# Patient Record
Sex: Male | Born: 1975 | Race: White | Hispanic: No | Marital: Single | Smoking: Former smoker
Health system: Southern US, Community
[De-identification: ages and names within clinical notes are randomized; demographics above are authoritative.]

## PROBLEM LIST (undated history)

## (undated) DIAGNOSIS — K922 Gastrointestinal hemorrhage, unspecified: Secondary | ICD-10-CM

## (undated) DIAGNOSIS — Z86018 Personal history of other benign neoplasm: Secondary | ICD-10-CM

## (undated) DIAGNOSIS — I359 Nonrheumatic aortic valve disorder, unspecified: Secondary | ICD-10-CM

## (undated) DIAGNOSIS — E237 Disorder of pituitary gland, unspecified: Secondary | ICD-10-CM

## (undated) HISTORY — PX: OTHER SURGICAL HISTORY: SHX169

## (undated) HISTORY — PX: ESOPHAGUS SURGERY: SHX626

---

## 2017-01-01 ENCOUNTER — Emergency Department (HOSPITAL_COMMUNITY)
Admission: EM | Admit: 2017-01-01 | Discharge: 2017-01-01 | Disposition: A | Discharge status: Home / Self Care | Payer: Self-pay | Attending: Emergency Medicine | Admitting: Emergency Medicine

## 2017-01-01 ENCOUNTER — Emergency Department (HOSPITAL_COMMUNITY): Payer: Self-pay

## 2017-01-01 ENCOUNTER — Encounter (HOSPITAL_COMMUNITY): Payer: Self-pay

## 2017-01-01 DIAGNOSIS — K219 Gastro-esophageal reflux disease without esophagitis: Secondary | ICD-10-CM

## 2017-01-01 DIAGNOSIS — F17210 Nicotine dependence, cigarettes, uncomplicated: Secondary | ICD-10-CM | POA: Insufficient documentation

## 2017-01-01 DIAGNOSIS — I10 Essential (primary) hypertension: Secondary | ICD-10-CM | POA: Insufficient documentation

## 2017-01-01 DIAGNOSIS — K920 Hematemesis: Secondary | ICD-10-CM

## 2017-01-01 DIAGNOSIS — K226 Gastro-esophageal laceration-hemorrhage syndrome: Secondary | ICD-10-CM | POA: Insufficient documentation

## 2017-01-01 DIAGNOSIS — Z79899 Other long term (current) drug therapy: Secondary | ICD-10-CM | POA: Insufficient documentation

## 2017-01-01 HISTORY — DX: Nonrheumatic aortic valve disorder, unspecified: I35.9

## 2017-01-01 HISTORY — DX: Personal history of other benign neoplasm: Z86.018

## 2017-01-01 HISTORY — DX: Gastrointestinal hemorrhage, unspecified: K92.2

## 2017-01-01 HISTORY — DX: Disorder of pituitary gland, unspecified: E23.7

## 2017-01-01 LAB — COMPREHENSIVE METABOLIC PANEL
ALK PHOS: 61 U/L (ref 38–126)
ALT: 41 U/L (ref 17–63)
ANION GAP: 9 (ref 5–15)
AST: 45 U/L — ABNORMAL HIGH (ref 15–41)
Albumin: 4.4 g/dL (ref 3.5–5.0)
BILIRUBIN TOTAL: 0.7 mg/dL (ref 0.3–1.2)
BUN: 11 mg/dL (ref 6–20)
CO2: 24 mmol/L (ref 22–32)
CREATININE: 0.83 mg/dL (ref 0.61–1.24)
Calcium: 8.9 mg/dL (ref 8.9–10.3)
Chloride: 104 mmol/L (ref 101–111)
GFR calc Af Amer: >60 mL/min (ref >60)
GFR calc non Af Amer: >60 mL/min (ref >60)
GLUCOSE: 105 mg/dL — AB (ref 65–99)
Potassium: 3.9 mmol/L (ref 3.5–5.1)
SODIUM: 137 mmol/L (ref 135–145)
TOTAL PROTEIN: 7.7 g/dL (ref 6.5–8.1)

## 2017-01-01 LAB — CBC
HCT: 42.6 % (ref 39.0–52.0)
HEMOGLOBIN: 13.9 g/dL (ref 13.0–17.0)
MCH: 26.6 pg (ref 26.0–34.0)
MCHC: 32.6 g/dL (ref 30.0–36.0)
MCV: 81.6 fL (ref 78.0–100.0)
PLATELETS: 429 10*3/uL — AB (ref 150–400)
RBC: 5.22 MIL/uL (ref 4.22–5.81)
RDW: 17.7 % — AB (ref 11.5–15.5)
WBC: 11.1 10*3/uL — ABNORMAL HIGH (ref 4.0–10.5)

## 2017-01-01 LAB — TYPE AND SCREEN
ABO/RH(D): A POS
ANTIBODY SCREEN: NEGATIVE

## 2017-01-01 LAB — ABO/RH: ABO/RH(D): A POS

## 2017-01-01 MED ORDER — MORPHINE SULFATE (PF) 4 MG/ML IV SOLN
4.0000 mg | Freq: Once | INTRAVENOUS | Status: AC
  Administered 2017-01-01: 4 mg via INTRAVENOUS
  Filled 2017-01-01: qty 1

## 2017-01-01 MED ORDER — SODIUM CHLORIDE 0.9 % IV BOLUS (SEPSIS)
1000.0000 mL | Freq: Once | INTRAVENOUS | Status: AC
  Administered 2017-01-01: 1000 mL via INTRAVENOUS

## 2017-01-01 MED ORDER — FAMOTIDINE IN NACL 20-0.9 MG/50ML-% IV SOLN
20.0000 mg | Freq: Once | INTRAVENOUS | Status: AC
  Administered 2017-01-01: 20 mg via INTRAVENOUS
  Filled 2017-01-01: qty 50

## 2017-01-01 MED ORDER — ONDANSETRON HCL 4 MG/2ML IJ SOLN
4.0000 mg | Freq: Once | INTRAMUSCULAR | Status: AC
  Administered 2017-01-01: 4 mg via INTRAVENOUS
  Filled 2017-01-01: qty 2

## 2017-01-01 MED ORDER — OXYCODONE-ACETAMINOPHEN 5-325 MG PO TABS: 1.0000 | ORAL_TABLET | ORAL | 0 refills | Status: AC | PRN

## 2017-01-01 NOTE — ED Triage Notes (Signed)
Patient c/o vomiting blood. Patient has bright red blood in an emesis bag. Patient has history of esophageal bleeding. [facility].

## 2017-01-01 NOTE — ED Provider Notes (Signed)
[facility]-EMERGENCY DEPT Provider Note   CSN: [id number] Arrival date & time: 01/01/17  1608     History   Chief Complaint Chief Complaint  Patient presents with  . Emesis    HPI Jesus Norman is a 41 y.o. male.  Pt presents to the ED today with n/v and hematemesis.  Pt has had this problem a lot.  He said it started after drinking and feels like he got something stuck.  He vomited forcefully after that.  He does not feel like anything is stuck now.  Cut and paste from last GI visit (Dr. Janeal Holmes) note Jesus Norman 1/8:  ASSESSMENT:   This is a 41yo M w/ PMH significant for HTN, VTE, anxiety, Raynaud's, Celiac's disease, and GERD s/p Nissen in 2012 who is seen for possible esophageal dysmotility. Patient underwent Nissen fundoplication in 2012 for severe GERD. Prior to this he denies issues with dysphagia or sensation of spasming in his chest though he thinks his pre-op esophageal manometry may have shown some abnormal esophageal motility.  For the past 3 years he has experienced progressively worsening solid food dysphagia, spasms in his chest, and heartburn. Multiple EGDs have failed to show an obstructive lesion or other cause other than his Nissen (which has appeared intact on these EGDs). 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 His symptoms have also failed to respond to empiric dilation in Dec 2016 (20mm Savary) and Oct 2017 (20mm balloon dilator). The etiology of his ongoing dysphagia and chest spasms remains unclear. His Nissen wrap may be too tight and may need more aggressive dilation with pneumatic dilator. Alternatively, he could have an underlying motility disorder such as achalasia, in which case he may ultimately need his Nissen taken down. To further clarify, will need to perform his esophageal manometry again, this time off all narcotic  medications to give [place] a better understanding of his underlying motility. Since he was unable to tolerate manometry probe placement a few months ago, will perform EGD to place manometry probe and to evaluate his esophagus and Nissen wrap.    PLAN:   - schedule for EGD with esophageal manometry probe placement with Dr. Enrigue Catena.  - continue PPI, baclofen, diltiazem, nitroglycerin, and GI cocktail PRN.  - patient asked to stop norco and avoid any other opiates 3 days prior to manometry.            Past Medical History:  Diagnosis Date  . Aortic valve disorder   . GI bleed   . History of benign esophageal tumor   . Pituitary abnormality (HCC)     There are no active problems to display for this patient.   Past Surgical History:  Procedure Laterality Date  . ESOPHAGUS SURGERY    . nissan fundiplication         Home Medications    Prior to Admission medications   Medication Sig Start Date End Date Taking? Authorizing Provider  acetaminophen (TYLENOL) 325 MG tablet Take 650 mg by mouth every 6 (six) hours as needed.   Yes Historical Provider, MD  albuterol (ACCUNEB) 0.63 MG/3ML nebulizer solution Take 0.63 mg by nebulization every 6 (six) hours as needed for wheezing.   Yes Historical Provider, MD  albuterol (PROVENTIL HFA;VENTOLIN HFA) 108 (90 Base) MCG/ACT inhaler Inhale 2 puffs into the lungs every 6 (six) hours as needed for wheezing.   Yes Historical Provider, MD  Alum &  Mag Hydroxide-Simeth (GI COCKTAIL) SUSP suspension Take 1 Dose by mouth daily. 10/08/16  Yes Historical Provider, MD  baclofen (LIORESAL) 10 MG tablet Take 10 mg by mouth 3 (three) times daily. 09/15/16  Yes Historical Provider, MD  benzonatate (TESSALON) 100 MG capsule Take 100 mg by mouth 3 (three) times daily as needed for cough. 12/21/16  Yes Historical Provider, MD  buPROPion (WELLBUTRIN) 75 MG tablet Take 150 mg by mouth 2 (two) times daily. 10/29/16  Yes Historical Provider, MD  Cholecalciferol  (VITAMIN D3) 5000 units CAPS Take 5,000 Units by mouth daily. 02/18/16  Yes Historical Provider, MD  dicyclomine (BENTYL) 20 MG tablet Take 20 mg by mouth 4 (four) times daily.   Yes Historical Provider, MD  diltiazem (CARDIZEM CD) 240 MG 24 hr capsule Take 240 mg by mouth daily. 05/18/16  Yes Historical Provider, MD  divalproex (DEPAKOTE ER) 500 MG 24 hr tablet Take 1,000 mg by mouth at bedtime.   Yes Historical Provider, MD  ferrous gluconate ([employer] IRON) 240 (27 FE) MG tablet Take 240 mg by mouth 2 (two) times daily. 05/18/16  Yes Historical Provider, MD  furosemide (LASIX) 20 MG tablet Take 20 mg by mouth daily. 07/06/16 07/06/17 Yes Historical Provider, MD  HYDROcodone-acetaminophen (NORCO/VICODIN) 5-325 MG tablet Take 1 tablet by mouth every 6 (six) hours as needed for pain. for pain 10/29/16  Yes Historical Provider, MD  mirtazapine (REMERON) 15 MG tablet Take 15 mg by mouth at bedtime. 10/08/16  Yes Historical Provider, MD  Multiple Vitamin (MULTI-VITAMIN) tablet Take 1 tablet by mouth daily.   Yes Historical Provider, MD  nitroGLYCERIN (NITROSTAT) 0.4 MG SL tablet Place 0.4 mg under the tongue once. 11/02/16  Yes Historical Provider, MD  ondansetron (ZOFRAN-ODT) 4 MG disintegrating tablet Take 4 mg by mouth every 8 (eight) hours as needed for nausea/vomiting. 09/15/16  Yes Historical Provider, MD  pantoprazole (PROTONIX) 40 MG tablet Take 40 mg by mouth 2 (two) times daily.   Yes Historical Provider, MD  sucralfate (CARAFATE) 1 GM/10ML suspension Take 15 mLs by mouth 4 (four) times daily.   Yes Historical Provider, MD  vitamin C (ASCORBIC ACID) 500 MG tablet Take 500 mg by mouth 2 (two) times daily.   Yes Historical Provider, MD  oxyCODONE-acetaminophen (PERCOCET/ROXICET) 5-325 MG tablet Take 1 tablet by mouth every 4 (four) hours as needed for severe pain. 01/01/17   Jacalyn Lefevre, MD    Family History Family History  Problem Relation Age of Onset  . COPD Mother   . Heart failure Mother   .  Heart failure Father   . Cancer Father   . COPD Father     Social History Social History  Substance Use Topics  . Smoking status: Current Every Day Smoker    Packs/day: 0.50    Types: Cigarettes  . Smokeless tobacco: Never Used  . Alcohol use Yes     Comment: daily use     Allergies   Compazine [prochlorperazine edisylate]; Doxycycline; Gluten meal; Keflex [cephalexin]; Nsaids; Penicillins; Reglan [metoclopramide]; and Wheat bran   Review of Systems Review of Systems  Gastrointestinal: Positive for nausea and vomiting.       Hematemesis  All other systems reviewed and are negative.    Physical Exam Updated Vital Signs BP 143/97 (BP Location: Right Arm)   Pulse 71   Temp 98 F (36.7 C) (Oral)   Resp 17   Ht 5\' 10"  (1.778 m)   Wt 260 lb (117.9 kg)   SpO2 98%  BMI 37.31 kg/m   Physical Exam  Constitutional: He is oriented to person, place, and time. He appears well-developed and well-nourished.  HENT:  Head: Normocephalic and atraumatic.  Right Ear: External ear normal.  Left Ear: External ear normal.  Nose: Nose normal.  Mouth/Throat: Oropharynx is clear and moist.  Eyes: Conjunctivae and EOM are normal. Pupils are equal, round, and reactive to light.  Neck: Normal range of motion. Neck supple.  Cardiovascular: Normal rate, regular rhythm, normal heart sounds and intact distal pulses.   Pulmonary/Chest: Effort normal and breath sounds normal.  Abdominal: Soft. Bowel sounds are normal. There is tenderness in the epigastric area.  Musculoskeletal: Normal range of motion.  Neurological: He is alert and oriented to person, place, and time.  Skin: Skin is warm and dry.  Psychiatric: He has a normal mood and affect. His behavior is normal. Judgment and thought content normal.  Nursing note and vitals reviewed.    ED Treatments / Results  Labs (all labs ordered are listed, but only abnormal results are displayed) Labs Reviewed  COMPREHENSIVE METABOLIC PANEL  - Abnormal; Notable for the following:       Result Value   Glucose, Bld 105 (*)    AST 45 (*)    All other components within normal limits  CBC - Abnormal; Notable for the following:    WBC 11.1 (*)    RDW 17.7 (*)    Platelets 429 (*)    All other components within normal limits  TYPE AND SCREEN  ABO/RH    EKG  EKG Interpretation None       Radiology Dg Chest 2 View  Result Date: 01/01/2017 CLINICAL DATA:  Cough for 3 days. EXAM: CHEST  2 VIEW COMPARISON:  None. FINDINGS: The heart size and mediastinal contours are within normal limits. Both lungs are clear. The visualized skeletal structures are unremarkable. IMPRESSION: No active cardiopulmonary disease. Electronically Signed   By: Myles RosenthalJohn  Stahl M.D.   On: 01/01/2017 18:19    Procedures Procedures (including critical care time)  Medications Ordered in ED Medications  sodium chloride 0.9 % bolus 1,000 mL (0 mLs Intravenous Stopped 01/01/17 1901)  ondansetron (ZOFRAN) injection 4 mg (4 mg Intravenous Given 01/01/17 1656)  famotidine (PEPCID) IVPB 20 mg premix (0 mg Intravenous Stopped 01/01/17 1725)  morphine 4 MG/ML injection 4 mg (4 mg Intravenous Given 01/01/17 1821)     Initial Impression / Assessment and Plan / ED Course  I have reviewed the triage vital signs and the nursing notes.  Pertinent labs & imaging results that were available during my care of the patient were reviewed by me and considered in my medical decision making (see chart for details).    Pt is feeling much better.  He is able to tolerate po fluids.  He has all the appropriate GERD meds.  [facility]  He knows to return if worse.  Final Clinical Impressions(s) / ED Diagnoses   Final diagnoses:  Mallory-Weiss tear  Hematemesis with nausea  Gastroesophageal reflux disease, esophagitis presence not specified    New Prescriptions New Prescriptions   OXYCODONE-ACETAMINOPHEN (PERCOCET/ROXICET) 5-325 MG  TABLET    Take 1 tablet by mouth every 4 (four) hours as needed for severe pain.     Jacalyn LefevreJulie Durene Dodge, MD 01/01/17 [phone]

## 2018-04-09 IMAGING — CR DG CHEST 2V
2 series · 2 of 2 positions shown · non-contrast
Comparison: None.

CLINICAL DATA: Cough for 3 days.

EXAM:
CHEST  2 VIEW

[w chest pa]
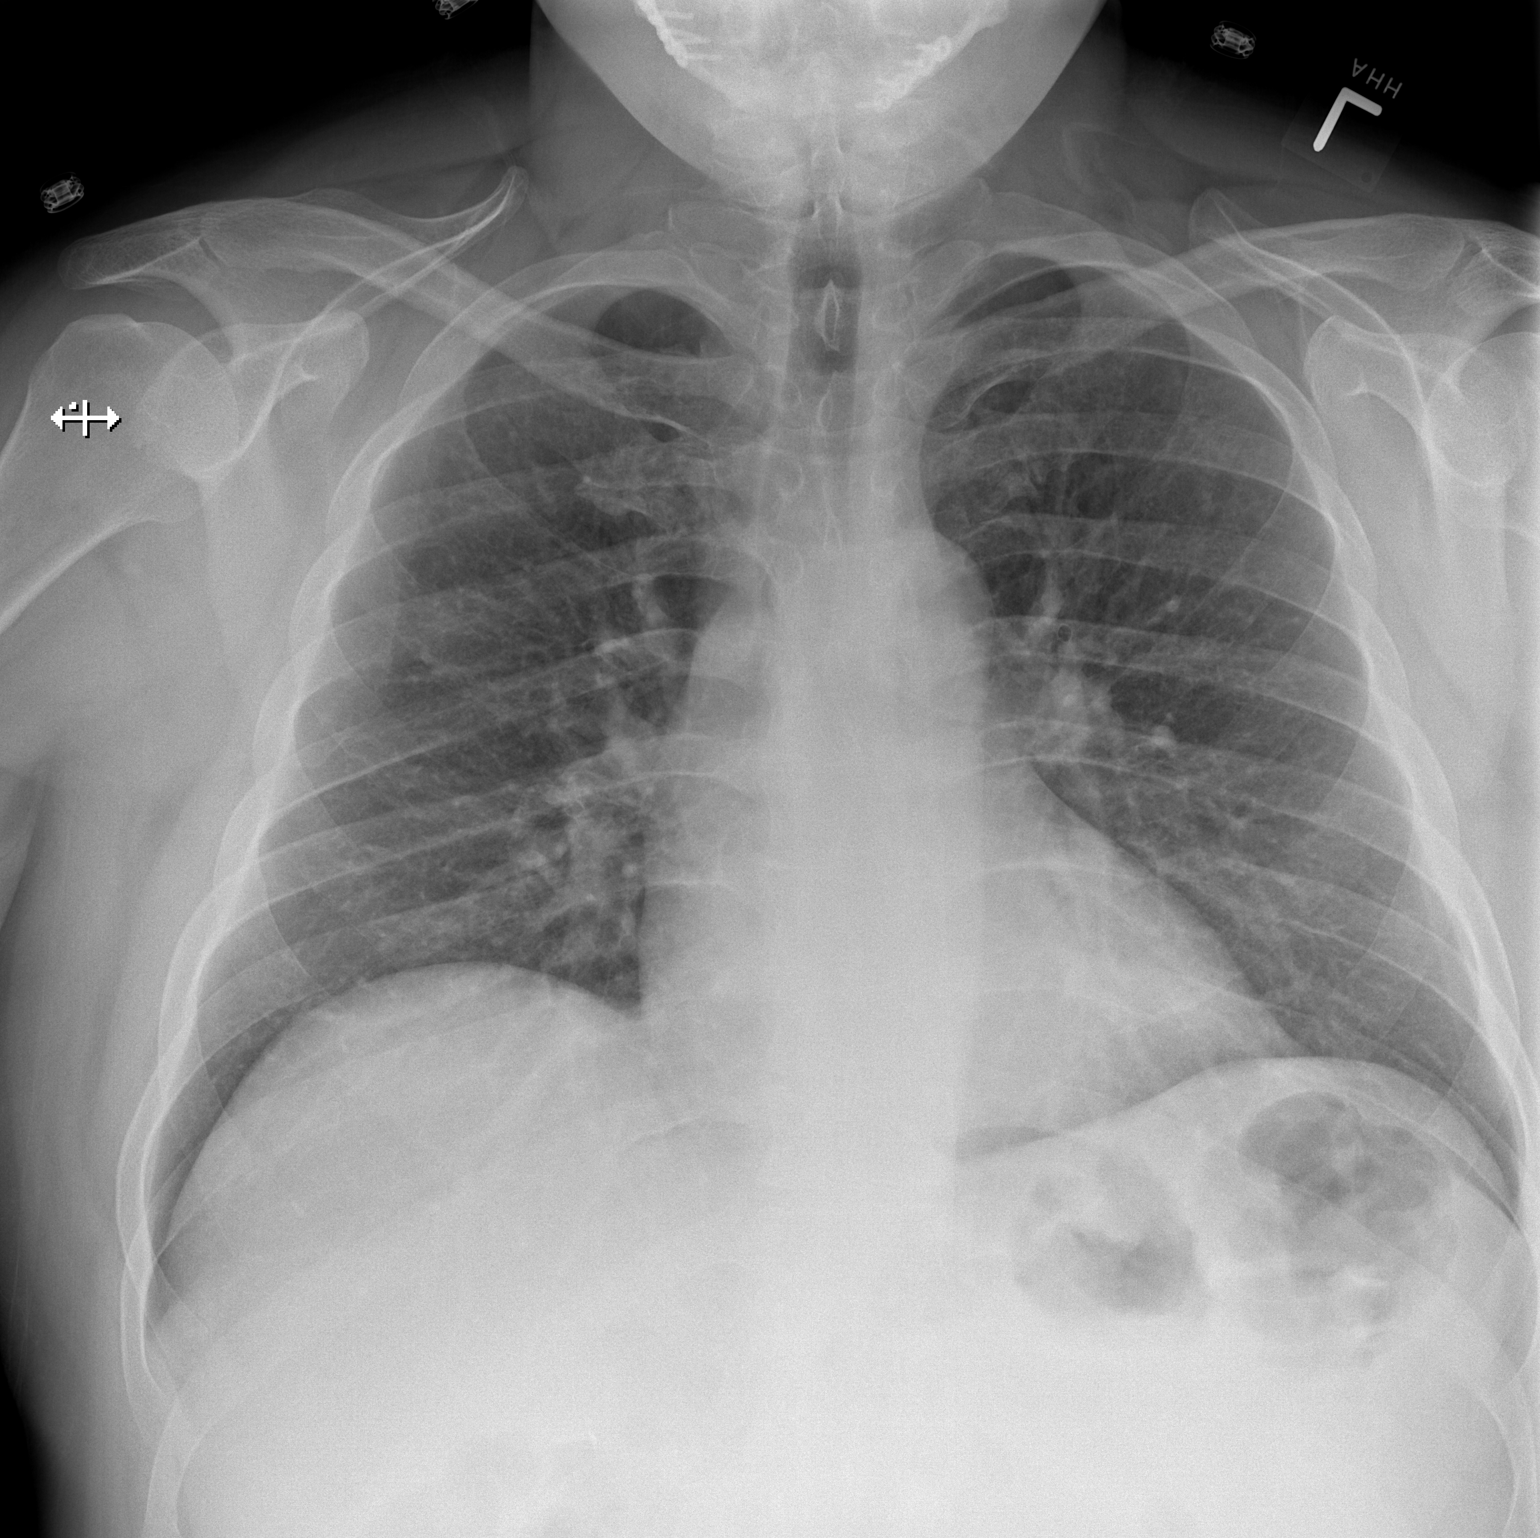

[w chest lat]
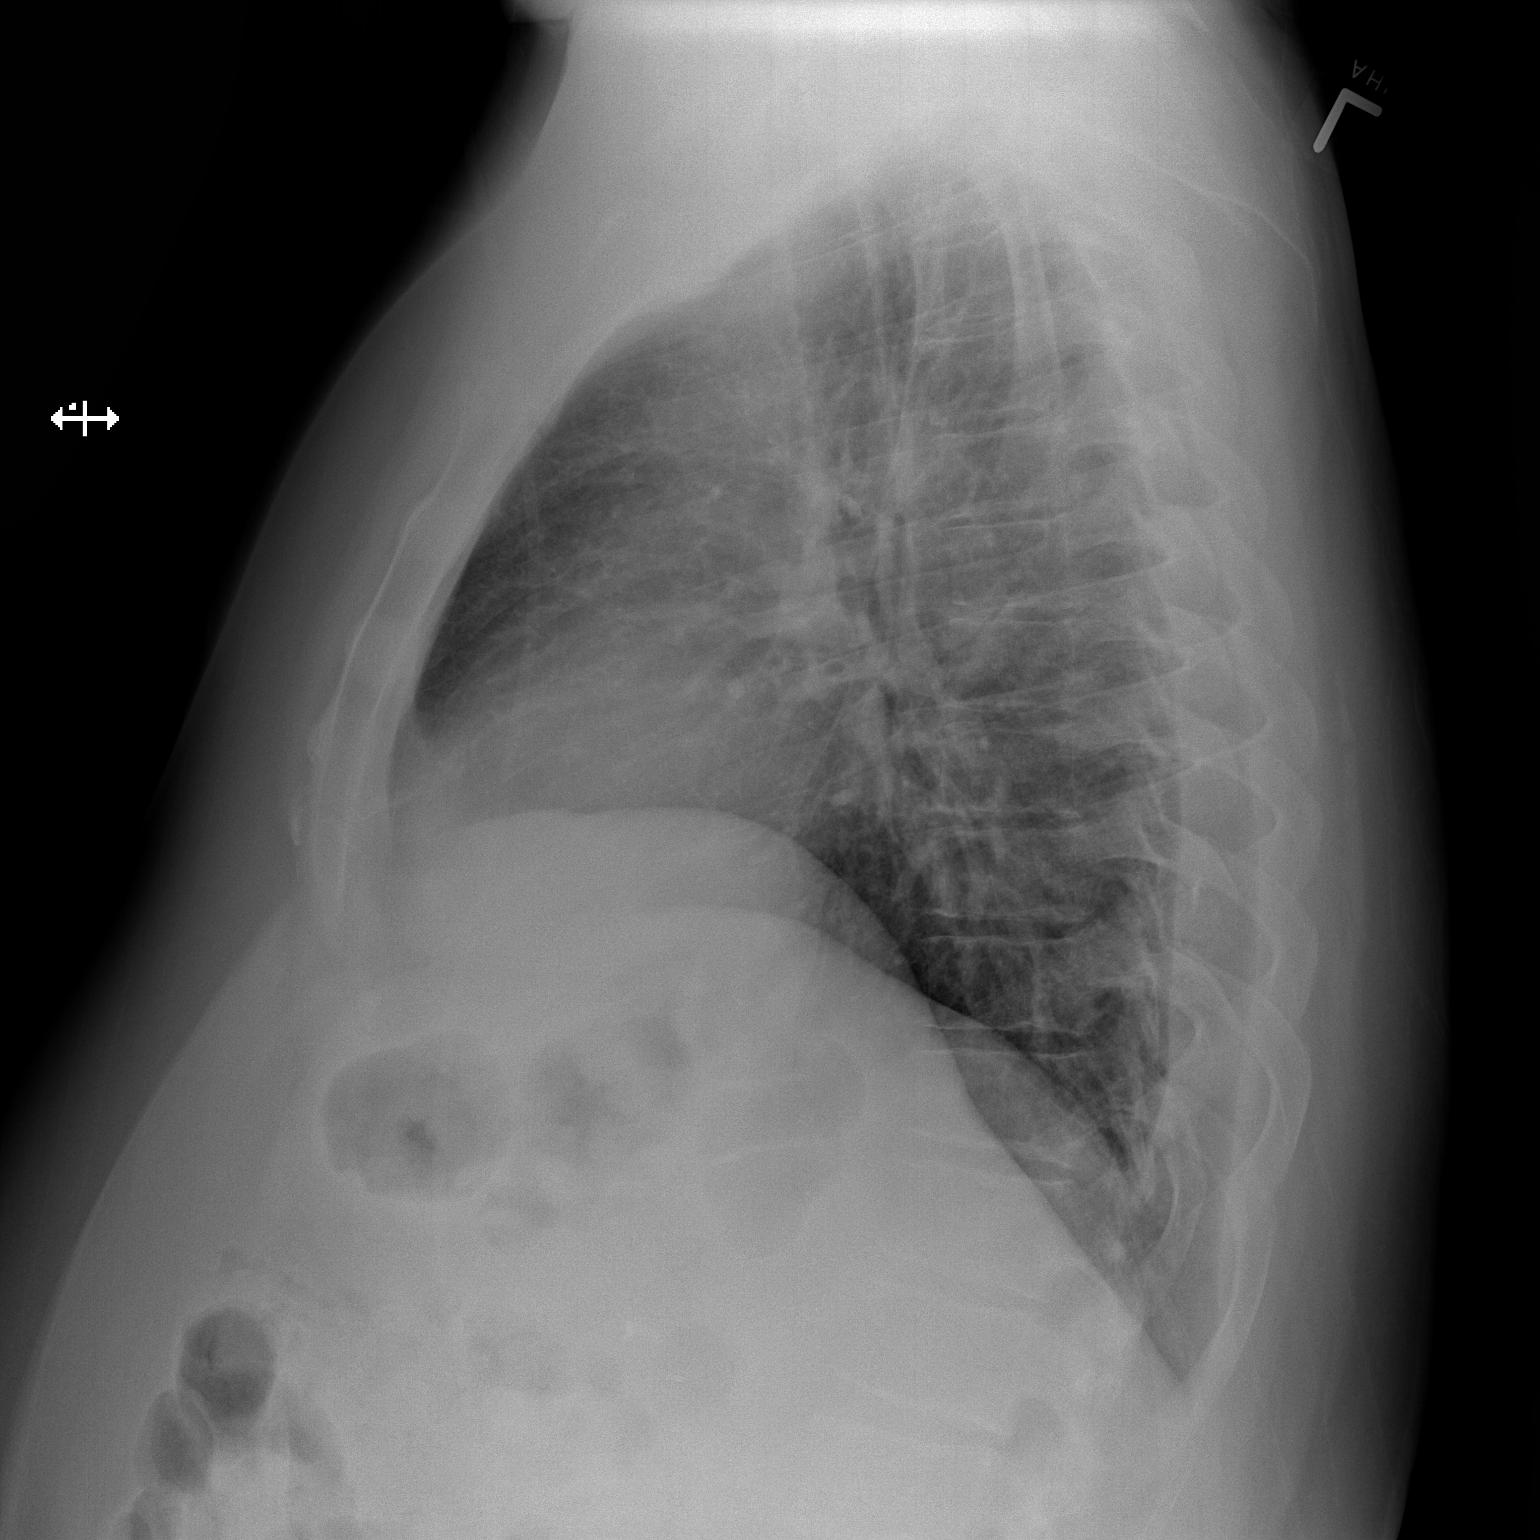

[2 of 2 positions shown; findings below may reference images not displayed]

FINDINGS: The heart size and mediastinal contours are within normal limits.
Both lungs are clear. The visualized skeletal structures are
unremarkable.
IMPRESSION: No active cardiopulmonary disease.

## 2022-09-23 ENCOUNTER — Encounter (HOSPITAL_COMMUNITY): Payer: Self-pay | Admitting: Emergency Medicine

## 2022-09-23 ENCOUNTER — Emergency Department (HOSPITAL_COMMUNITY)

## 2022-09-23 ENCOUNTER — Inpatient Hospital Stay (HOSPITAL_COMMUNITY)
Admission: EM | Admit: 2022-09-23 | Discharge: 2022-09-26 | DRG: 379 | Disposition: A | Discharge status: Home / Self Care | Attending: Internal Medicine | Admitting: Internal Medicine

## 2022-09-23 ENCOUNTER — Other Ambulatory Visit: Payer: Self-pay

## 2022-09-23 DIAGNOSIS — R1319 Other dysphagia: Secondary | ICD-10-CM

## 2022-09-23 DIAGNOSIS — J449 Chronic obstructive pulmonary disease, unspecified: Secondary | ICD-10-CM | POA: Diagnosis present

## 2022-09-23 DIAGNOSIS — E669 Obesity, unspecified: Secondary | ICD-10-CM | POA: Diagnosis present

## 2022-09-23 DIAGNOSIS — K922 Gastrointestinal hemorrhage, unspecified: Principal | ICD-10-CM

## 2022-09-23 DIAGNOSIS — Z91018 Allergy to other foods: Secondary | ICD-10-CM | POA: Diagnosis not present

## 2022-09-23 DIAGNOSIS — K219 Gastro-esophageal reflux disease without esophagitis: Secondary | ICD-10-CM | POA: Diagnosis present

## 2022-09-23 DIAGNOSIS — Z886 Allergy status to analgesic agent status: Secondary | ICD-10-CM | POA: Diagnosis not present

## 2022-09-23 DIAGNOSIS — R1310 Dysphagia, unspecified: Secondary | ICD-10-CM | POA: Diagnosis not present

## 2022-09-23 DIAGNOSIS — Z7951 Long term (current) use of inhaled steroids: Secondary | ICD-10-CM | POA: Diagnosis not present

## 2022-09-23 DIAGNOSIS — Z87891 Personal history of nicotine dependence: Secondary | ICD-10-CM

## 2022-09-23 DIAGNOSIS — Z7985 Long-term (current) use of injectable non-insulin antidiabetic drugs: Secondary | ICD-10-CM

## 2022-09-23 DIAGNOSIS — Z7901 Long term (current) use of anticoagulants: Secondary | ICD-10-CM

## 2022-09-23 DIAGNOSIS — I10 Essential (primary) hypertension: Secondary | ICD-10-CM | POA: Diagnosis present

## 2022-09-23 DIAGNOSIS — Z79899 Other long term (current) drug therapy: Secondary | ICD-10-CM

## 2022-09-23 DIAGNOSIS — Z881 Allergy status to other antibiotic agents status: Secondary | ICD-10-CM

## 2022-09-23 DIAGNOSIS — E119 Type 2 diabetes mellitus without complications: Secondary | ICD-10-CM | POA: Diagnosis present

## 2022-09-23 DIAGNOSIS — K224 Dyskinesia of esophagus: Secondary | ICD-10-CM | POA: Diagnosis present

## 2022-09-23 DIAGNOSIS — Z8249 Family history of ischemic heart disease and other diseases of the circulatory system: Secondary | ICD-10-CM

## 2022-09-23 DIAGNOSIS — Z880 Allergy status to penicillin: Secondary | ICD-10-CM

## 2022-09-23 DIAGNOSIS — E876 Hypokalemia: Secondary | ICD-10-CM | POA: Diagnosis present

## 2022-09-23 DIAGNOSIS — E785 Hyperlipidemia, unspecified: Secondary | ICD-10-CM

## 2022-09-23 DIAGNOSIS — K2970 Gastritis, unspecified, without bleeding: Secondary | ICD-10-CM | POA: Diagnosis not present

## 2022-09-23 DIAGNOSIS — I359 Nonrheumatic aortic valve disorder, unspecified: Secondary | ICD-10-CM | POA: Diagnosis present

## 2022-09-23 DIAGNOSIS — Z86711 Personal history of pulmonary embolism: Secondary | ICD-10-CM | POA: Diagnosis not present

## 2022-09-23 DIAGNOSIS — K900 Celiac disease: Secondary | ICD-10-CM | POA: Diagnosis present

## 2022-09-23 DIAGNOSIS — Z888 Allergy status to other drugs, medicaments and biological substances status: Secondary | ICD-10-CM

## 2022-09-23 DIAGNOSIS — Z6839 Body mass index (BMI) 39.0-39.9, adult: Secondary | ICD-10-CM

## 2022-09-23 DIAGNOSIS — Z86718 Personal history of other venous thrombosis and embolism: Secondary | ICD-10-CM | POA: Diagnosis not present

## 2022-09-23 DIAGNOSIS — Z825 Family history of asthma and other chronic lower respiratory diseases: Secondary | ICD-10-CM | POA: Diagnosis not present

## 2022-09-23 DIAGNOSIS — K2971 Gastritis, unspecified, with bleeding: Secondary | ICD-10-CM | POA: Diagnosis present

## 2022-09-23 DIAGNOSIS — K2100 Gastro-esophageal reflux disease with esophagitis, without bleeding: Secondary | ICD-10-CM | POA: Diagnosis not present

## 2022-09-23 LAB — COMPREHENSIVE METABOLIC PANEL
ALT: 48 U/L — ABNORMAL HIGH (ref 0–44)
AST: 28 U/L (ref 15–41)
Albumin: 4.2 g/dL (ref 3.5–5.0)
Alkaline Phosphatase: 79 U/L (ref 38–126)
Anion gap: 8 (ref 5–15)
BUN: 18 mg/dL (ref 6–20)
CO2: 23 mmol/L (ref 22–32)
Calcium: 8.4 mg/dL — ABNORMAL LOW (ref 8.9–10.3)
Chloride: 108 mmol/L (ref 98–111)
Creatinine, Ser: 0.9 mg/dL (ref 0.61–1.24)
GFR, Estimated: >60 mL/min (ref >60)
Glucose, Bld: 135 mg/dL — ABNORMAL HIGH (ref 70–99)
Potassium: 3.4 mmol/L — ABNORMAL LOW (ref 3.5–5.1)
Sodium: 139 mmol/L (ref 135–145)
Total Bilirubin: 0.4 mg/dL (ref 0.3–1.2)
Total Protein: 6.9 g/dL (ref 6.5–8.1)

## 2022-09-23 LAB — CBC WITH DIFFERENTIAL/PLATELET
Abs Immature Granulocytes: 0.04 10*3/uL (ref 0.00–0.07)
Basophils Absolute: 0 10*3/uL (ref 0.0–0.1)
Basophils Relative: 0 %
Eosinophils Absolute: 0.2 10*3/uL (ref 0.0–0.5)
Eosinophils Relative: 2 %
HCT: 43.5 % (ref 39.0–52.0)
Hemoglobin: 14.4 g/dL (ref 13.0–17.0)
Immature Granulocytes: 0 %
Lymphocytes Relative: 22 %
Lymphs Abs: 2.4 10*3/uL (ref 0.7–4.0)
MCH: 29.3 pg (ref 26.0–34.0)
MCHC: 33.1 g/dL (ref 30.0–36.0)
MCV: 88.4 fL (ref 80.0–100.0)
Monocytes Absolute: 0.9 10*3/uL (ref 0.1–1.0)
Monocytes Relative: 8 %
Neutro Abs: 7.1 10*3/uL (ref 1.7–7.7)
Neutrophils Relative %: 68 %
Platelets: 276 10*3/uL (ref 150–400)
RBC: 4.92 MIL/uL (ref 4.22–5.81)
RDW: 14.4 % (ref 11.5–15.5)
WBC: 10.6 10*3/uL — ABNORMAL HIGH (ref 4.0–10.5)
nRBC: 0 % (ref 0.0–0.2)

## 2022-09-23 LAB — HEMOGLOBIN AND HEMATOCRIT, BLOOD
HCT: 38.8 % — ABNORMAL LOW (ref 39.0–52.0)
Hemoglobin: 12.7 g/dL — ABNORMAL LOW (ref 13.0–17.0)

## 2022-09-23 LAB — PROTIME-INR
INR: 1.4 — ABNORMAL HIGH (ref 0.8–1.2)
Prothrombin Time: 16.7 seconds — ABNORMAL HIGH (ref 11.4–15.2)

## 2022-09-23 LAB — ETHANOL: Alcohol, Ethyl (B): <10 mg/dL (ref <10)

## 2022-09-23 MED ORDER — METOCLOPRAMIDE HCL 5 MG/ML IJ SOLN
10.0000 mg | Freq: Three times a day (TID) | INTRAMUSCULAR | Status: DC
  Administered 2022-09-24 – 2022-09-25 (×5): 10 mg via INTRAVENOUS
  Filled 2022-09-23 (×5): qty 2

## 2022-09-23 MED ORDER — PANTOPRAZOLE SODIUM 40 MG IV SOLR
40.0000 mg | Freq: Once | INTRAVENOUS | Status: AC
  Administered 2022-09-23: 40 mg via INTRAVENOUS
  Filled 2022-09-23: qty 10

## 2022-09-23 NOTE — ED Triage Notes (Addendum)
Pt from [facility] d/t hematemesis w/difficulty swallowing x 2 days. Hx of esophageal stretching, varices, gastric ulcers. Pt on xarelto.

## 2022-09-23 NOTE — ED Notes (Signed)
Attempted report 

## 2022-09-23 NOTE — ED Provider Notes (Signed)
[facility] EMERGENCY DEPARTMENT Provider Note   CSN: [id number] Arrival date & time: 09/23/22  1820     History  Chief Complaint  Patient presents with   Dysphagia    Sourish Jesus Norman is a 46 y.o. male.  HPI Patient presents with difficulty swallowing.  Feels as if food gets stuck in his esophagus.  States he has had some vomiting.  He is on Xarelto for previous DVTs.  Has had previous gastritis.  Has had previous esophageal stretching's.  Has seen providers at [facility] and at [facility].  Also at [facility].  Patient is currently in custody.  States he threw up and had a little bit of blood.  Then became a little more blood.  Mild pain.  No fevers.  No lightheadedness.  States he feels that he has had a little chills also.  States he was told 1 time that he had esophageal varices although most recent endoscopy from around a month and a half ago did not show any varices and showed essentially normal esophagus.   Past Medical History:  Diagnosis Date   Aortic valve disorder    GI bleed    History of benign esophageal tumor    Pituitary abnormality [facility])    Past Surgical History:  Procedure Laterality Date   ESOPHAGUS SURGERY     nissan fundiplication       Home Medications Prior to Admission medications   Medication Sig Start Date End Date Taking? Authorizing Provider  acetaminophen (TYLENOL) 325 MG tablet Take 325 mg by mouth every 6 (six) hours as needed for moderate pain.   Yes [provider]  albuterol (PROVENTIL HFA;VENTOLIN HFA) 108 (90 Base) MCG/ACT inhaler Inhale 2 puffs into the lungs every 6 (six) hours as needed for wheezing.   Yes [provider]  amLODipine (NORVASC) 5 MG tablet Take by mouth. 04/18/22  Yes [provider]  ascorbic acid (VITAMIN C) 500 MG tablet Take by mouth.   Yes [provider]  baclofen (LIORESAL) 10 MG tablet Take 10 mg by mouth 3 (three) times daily. 09/15/16  Yes [provider]  buPROPion (WELLBUTRIN) 75 MG  tablet Take 150 mg by mouth 2 (two) times daily. 10/29/16  Yes [provider]  dicyclomine (BENTYL) 20 MG tablet Take 20 mg by mouth 4 (four) times daily.   Yes [provider]  ferrous sulfate 325 (65 FE) MG tablet Take by mouth.   Yes [provider]  fluticasone-salmeterol (ADVAIR) 250-50 MCG/ACT AEPB Inhale into the lungs.   Yes [provider]  furosemide (LASIX) 20 MG tablet Take by mouth daily. 07/06/16 09/23/22 Yes [provider]  mometasone-formoterol (DULERA) 100-5 MCG/ACT AERO Inhale into the lungs. 02/11/22  Yes [provider]  Multiple Vitamin (MULTI-VITAMIN) tablet Take 1 tablet by mouth daily.   Yes [provider]  naloxone (NARCAN) nasal spray 4 mg/0.1 mL Place into the nose. 04/07/21  Yes [provider]  nitroGLYCERIN (NITROSTAT) 0.4 MG SL tablet Place 0.4 mg under the tongue once. 11/02/16  Yes [provider]  ondansetron (ZOFRAN-ODT) 4 MG disintegrating tablet Take 4 mg by mouth every 8 (eight) hours as needed for nausea/vomiting. 09/15/16  Yes [provider]  oxyCODONE-acetaminophen (PERCOCET/ROXICET) 5-325 MG tablet Take 1 tablet by mouth every 4 (four) hours as needed for severe pain. 01/01/17  Yes Jacalyn Lefevre, MD  rivaroxaban (XARELTO) 20 MG TABS tablet Take 20 mg by mouth daily. 02/10/19  Yes [provider]  rosuvastatin (CRESTOR) 10  MG tablet Take by mouth. 02/11/22  Yes [provider]  spironolactone (ALDACTONE) 25 MG tablet Take 25 mg by mouth daily. 02/11/22  Yes [provider]  sucralfate (CARAFATE) 1 g tablet Take 1 g by mouth 4 (four) times daily.   Yes [provider]  tirzepatide Darcel Bayley) 5 MG/0.5ML Pen Inject into the skin. 03/12/22  Yes [provider]  vitamin C (ASCORBIC ACID) 500 MG tablet Take 500 mg by mouth 2 (two) times daily.   Yes [provider]  diphenhydrAMINE (BENADRYL) 25 mg capsule Take by mouth.     [provider]  divalproex (DEPAKOTE ER) 500 MG 24 hr tablet Take 1,000 mg by mouth at bedtime.    [provider]      Allergies    Compazine [prochlorperazine edisylate], Doxycycline, Gluten meal, Keflex [cephalexin], Nsaids, Penicillins, Reglan [metoclopramide], and Wheat bran    Review of Systems   Review of Systems  Physical Exam Updated Vital Signs BP (!) 103/58   Pulse 88   Temp 98.2 F (36.8 C) (Oral)   Resp 19   Ht 5\' 10"  (1.778 m)   Wt 122.9 kg   SpO2 93%   BMI 38.88 kg/m  Physical Exam Vitals and nursing note reviewed.  HENT:     Head: Atraumatic.  Eyes:     Pupils: Pupils are equal, round, and reactive to light.  Cardiovascular:     Rate and Rhythm: Regular rhythm. Tachycardia present.  Pulmonary:     Breath sounds: No wheezing.  Abdominal:     Tenderness: There is no abdominal tenderness.  Musculoskeletal:        General: No tenderness.  Skin:    General: Skin is warm.     Capillary Refill: Capillary refill takes less than 2 seconds.  Neurological:     Mental Status: He is alert and oriented to person, place, and time.     ED Results / Procedures / Treatments   Labs (all labs ordered are listed, but only abnormal results are displayed) Labs Reviewed  COMPREHENSIVE METABOLIC PANEL - Abnormal; Notable for the following components:      Result Value   Potassium 3.4 (*)    Glucose, Bld 135 (*)    Calcium 8.4 (*)    ALT 48 (*)    All other components within normal limits  PROTIME-INR - Abnormal; Notable for the following components:   Prothrombin Time 16.7 (*)    INR 1.4 (*)    All other components within normal limits  CBC WITH DIFFERENTIAL/PLATELET - Abnormal; Notable for the following components:   WBC 10.6 (*)    All other components within normal limits  ETHANOL    EKG None  Radiology DG Chest Portable 1 View  Result Date: 09/23/2022 CLINICAL DATA:  Chest pain.  Hematemesis. EXAM: PORTABLE CHEST 1 VIEW COMPARISON:   Chest 01/01/2017 FINDINGS: The heart size and mediastinal contours are within normal limits. Both lungs are clear. The visualized skeletal structures are unremarkable. IMPRESSION: No active disease. Electronically Signed   By: Franchot Gallo M.D.   On: 09/23/2022 19:31    Procedures Procedures    Medications Ordered in ED Medications - No data to display  ED Course/ Medical Decision Making/ A&P                           Medical Decision Making Amount and/or Complexity of Data Reviewed Labs: ordered. Radiology: ordered.   Patient with  feeling of food getting stuck in his esophagus.  For some potential hematemesis.  Well-appearing with mild tachycardia.  Differential diagnosis includes esophageal strictures, Mallory-Weiss tear, bleeding.  Questionable history of esophageal varices but reviewing recent admission notes did not have varices with recent endoscopy.  We will get basic blood work and continue to evaluate. Blood work reassuring.  Slight hypotension but hemoglobin reassuring.  Is on anticoagulation however and has had another episode of a small amount of hematemesis here.  Discussed with Dr. Levon Hedger from gastroenterology.  Believes patient can stay here.  Recommended PPI and a dose of Reglan.  Likely will have scope tomorrow.  Will discuss with hospitalist.  CRITICAL CARE Performed by: Benjiman Core Total critical care time: 30 minutes Critical care time was exclusive of separately billable procedures and treating other patients. Critical care was necessary to treat or prevent imminent or life-threatening deterioration. Critical care was time spent personally by me on the following activities: development of treatment plan with patient and/or surrogate as well as nursing, discussions with consultants, evaluation of patient's response to treatment, examination of patient, obtaining history from patient or surrogate, ordering and performing treatments and interventions, ordering  and review of laboratory studies, ordering and review of radiographic studies, pulse oximetry and re-evaluation of patient's condition.         Final Clinical Impression(s) / ED Diagnoses Final diagnoses:  Gastrointestinal hemorrhage, unspecified gastrointestinal hemorrhage type    Rx / DC Orders ED Discharge Orders     None         Benjiman Core, MD 09/23/22 2248

## 2022-09-24 ENCOUNTER — Other Ambulatory Visit: Payer: Self-pay

## 2022-09-24 ENCOUNTER — Inpatient Hospital Stay (HOSPITAL_COMMUNITY): Admitting: Anesthesiology

## 2022-09-24 ENCOUNTER — Encounter (HOSPITAL_COMMUNITY): Payer: Self-pay | Admitting: Family Medicine

## 2022-09-24 ENCOUNTER — Encounter (HOSPITAL_COMMUNITY)
Admission: EM | Disposition: A | Discharge status: Home / Self Care | Payer: Self-pay | Source: Home / Self Care | Attending: Internal Medicine

## 2022-09-24 DIAGNOSIS — K922 Gastrointestinal hemorrhage, unspecified: Secondary | ICD-10-CM | POA: Diagnosis not present

## 2022-09-24 DIAGNOSIS — E876 Hypokalemia: Secondary | ICD-10-CM

## 2022-09-24 DIAGNOSIS — R1319 Other dysphagia: Secondary | ICD-10-CM

## 2022-09-24 DIAGNOSIS — J449 Chronic obstructive pulmonary disease, unspecified: Secondary | ICD-10-CM | POA: Diagnosis not present

## 2022-09-24 DIAGNOSIS — I10 Essential (primary) hypertension: Secondary | ICD-10-CM

## 2022-09-24 DIAGNOSIS — K219 Gastro-esophageal reflux disease without esophagitis: Secondary | ICD-10-CM

## 2022-09-24 DIAGNOSIS — Z87891 Personal history of nicotine dependence: Secondary | ICD-10-CM

## 2022-09-24 DIAGNOSIS — E785 Hyperlipidemia, unspecified: Secondary | ICD-10-CM | POA: Diagnosis not present

## 2022-09-24 DIAGNOSIS — R1310 Dysphagia, unspecified: Secondary | ICD-10-CM

## 2022-09-24 DIAGNOSIS — Z7901 Long term (current) use of anticoagulants: Secondary | ICD-10-CM

## 2022-09-24 DIAGNOSIS — K2970 Gastritis, unspecified, without bleeding: Secondary | ICD-10-CM

## 2022-09-24 DIAGNOSIS — K2971 Gastritis, unspecified, with bleeding: Secondary | ICD-10-CM

## 2022-09-24 HISTORY — PX: ESOPHAGOGASTRODUODENOSCOPY (EGD) WITH PROPOFOL: SHX5813

## 2022-09-24 LAB — COMPREHENSIVE METABOLIC PANEL
ALT: 50 U/L — ABNORMAL HIGH (ref 0–44)
AST: 29 U/L (ref 15–41)
Albumin: 4.1 g/dL (ref 3.5–5.0)
Alkaline Phosphatase: 77 U/L (ref 38–126)
Anion gap: 11 (ref 5–15)
BUN: 17 mg/dL (ref 6–20)
CO2: 23 mmol/L (ref 22–32)
Calcium: 8.6 mg/dL — ABNORMAL LOW (ref 8.9–10.3)
Chloride: 105 mmol/L (ref 98–111)
Creatinine, Ser: 0.86 mg/dL (ref 0.61–1.24)
GFR, Estimated: >60 mL/min (ref >60)
Glucose, Bld: 114 mg/dL — ABNORMAL HIGH (ref 70–99)
Potassium: 3.3 mmol/L — ABNORMAL LOW (ref 3.5–5.1)
Sodium: 139 mmol/L (ref 135–145)
Total Bilirubin: 0.4 mg/dL (ref 0.3–1.2)
Total Protein: 6.8 g/dL (ref 6.5–8.1)

## 2022-09-24 LAB — CBC WITH DIFFERENTIAL/PLATELET
Abs Immature Granulocytes: 0.02 10*3/uL (ref 0.00–0.07)
Basophils Absolute: 0 10*3/uL (ref 0.0–0.1)
Basophils Relative: 0 %
Eosinophils Absolute: 0.1 10*3/uL (ref 0.0–0.5)
Eosinophils Relative: 1 %
HCT: 38.5 % — ABNORMAL LOW (ref 39.0–52.0)
Hemoglobin: 12.6 g/dL — ABNORMAL LOW (ref 13.0–17.0)
Immature Granulocytes: 0 %
Lymphocytes Relative: 22 %
Lymphs Abs: 2.2 10*3/uL (ref 0.7–4.0)
MCH: 29.4 pg (ref 26.0–34.0)
MCHC: 32.7 g/dL (ref 30.0–36.0)
MCV: 89.7 fL (ref 80.0–100.0)
Monocytes Absolute: 1.3 10*3/uL — ABNORMAL HIGH (ref 0.1–1.0)
Monocytes Relative: 13 %
Neutro Abs: 6.1 10*3/uL (ref 1.7–7.7)
Neutrophils Relative %: 64 %
Platelets: 242 10*3/uL (ref 150–400)
RBC: 4.29 MIL/uL (ref 4.22–5.81)
RDW: 14.8 % (ref 11.5–15.5)
WBC: 9.7 10*3/uL (ref 4.0–10.5)
nRBC: 0 % (ref 0.0–0.2)

## 2022-09-24 LAB — MAGNESIUM: Magnesium: 1.8 mg/dL (ref 1.7–2.4)

## 2022-09-24 LAB — HEMOGLOBIN AND HEMATOCRIT, BLOOD
HCT: 37.8 % — ABNORMAL LOW (ref 39.0–52.0)
Hemoglobin: 12.5 g/dL — ABNORMAL LOW (ref 13.0–17.0)

## 2022-09-24 LAB — HIV ANTIBODY (ROUTINE TESTING W REFLEX): HIV Screen 4th Generation wRfx: NONREACTIVE

## 2022-09-24 SURGERY — ESOPHAGOGASTRODUODENOSCOPY (EGD) WITH PROPOFOL
Anesthesia: General

## 2022-09-24 MED ORDER — ALBUTEROL SULFATE HFA 108 (90 BASE) MCG/ACT IN AERS: 2.0000 | INHALATION_SPRAY | Freq: Four times a day (QID) | RESPIRATORY_TRACT | Status: DC | PRN

## 2022-09-24 MED ORDER — LIDOCAINE HCL (CARDIAC) PF 100 MG/5ML IV SOSY
PREFILLED_SYRINGE | INTRAVENOUS | Status: DC | PRN
  Administered 2022-09-24: 100 mg via INTRAVENOUS

## 2022-09-24 MED ORDER — LACTATED RINGERS IV SOLN: INTRAVENOUS | Status: DC

## 2022-09-24 MED ORDER — ONDANSETRON HCL 4 MG/2ML IJ SOLN
4.0000 mg | Freq: Four times a day (QID) | INTRAMUSCULAR | Status: DC | PRN
  Administered 2022-09-24: 4 mg via INTRAVENOUS
  Filled 2022-09-24: qty 2

## 2022-09-24 MED ORDER — SODIUM CHLORIDE 0.9 % IV SOLN: INTRAVENOUS | Status: DC

## 2022-09-24 MED ORDER — ACETAMINOPHEN 650 MG RE SUPP: 650.0000 mg | Freq: Four times a day (QID) | RECTAL | Status: DC | PRN

## 2022-09-24 MED ORDER — ACETAMINOPHEN 325 MG PO TABS: 650.0000 mg | ORAL_TABLET | Freq: Four times a day (QID) | ORAL | Status: DC | PRN

## 2022-09-24 MED ORDER — PROPOFOL 10 MG/ML IV BOLUS
INTRAVENOUS | Status: DC | PRN
  Administered 2022-09-24 (×2): 50 mg via INTRAVENOUS

## 2022-09-24 MED ORDER — MOMETASONE FURO-FORMOTEROL FUM 200-5 MCG/ACT IN AERO
2.0000 | INHALATION_SPRAY | Freq: Two times a day (BID) | RESPIRATORY_TRACT | Status: DC
  Administered 2022-09-24 – 2022-09-26 (×5): 2 via RESPIRATORY_TRACT
  Filled 2022-09-24: qty 8.8

## 2022-09-24 MED ORDER — ONDANSETRON HCL 4 MG PO TABS
4.0000 mg | ORAL_TABLET | Freq: Four times a day (QID) | ORAL | Status: DC | PRN
  Administered 2022-09-25 – 2022-09-26 (×3): 4 mg via ORAL
  Filled 2022-09-24 (×4): qty 1

## 2022-09-24 MED ORDER — PROPOFOL 10 MG/ML IV BOLUS: INTRAVENOUS | Status: DC | PRN

## 2022-09-24 MED ORDER — OXYCODONE HCL 5 MG PO TABS
5.0000 mg | ORAL_TABLET | ORAL | Status: DC | PRN
  Administered 2022-09-24 – 2022-09-25 (×6): 5 mg via ORAL
  Filled 2022-09-24 (×7): qty 1

## 2022-09-24 MED ORDER — ROSUVASTATIN CALCIUM 20 MG PO TABS
10.0000 mg | ORAL_TABLET | Freq: Every day | ORAL | Status: DC
  Administered 2022-09-24 – 2022-09-26 (×3): 10 mg via ORAL
  Filled 2022-09-24 (×3): qty 1

## 2022-09-24 MED ORDER — LIDOCAINE VISCOUS HCL 2 % MT SOLN
15.0000 mL | Freq: Once | OROMUCOSAL | Status: AC
  Administered 2022-09-24: 15 mL via OROMUCOSAL
  Filled 2022-09-24: qty 15

## 2022-09-24 MED ORDER — POTASSIUM CHLORIDE 10 MEQ/100ML IV SOLN
10.0000 meq | INTRAVENOUS | Status: AC
  Administered 2022-09-24 (×3): 10 meq via INTRAVENOUS
  Filled 2022-09-24 (×3): qty 100

## 2022-09-24 MED ORDER — ALBUTEROL SULFATE (2.5 MG/3ML) 0.083% IN NEBU: 2.5000 mg | INHALATION_SOLUTION | Freq: Four times a day (QID) | RESPIRATORY_TRACT | Status: DC | PRN

## 2022-09-24 MED ORDER — BUPROPION HCL 75 MG PO TABS
150.0000 mg | ORAL_TABLET | Freq: Two times a day (BID) | ORAL | Status: DC
  Administered 2022-09-24 – 2022-09-26 (×5): 150 mg via ORAL
  Filled 2022-09-24 (×8): qty 2

## 2022-09-24 MED ORDER — PANTOPRAZOLE SODIUM 40 MG IV SOLR
40.0000 mg | Freq: Two times a day (BID) | INTRAVENOUS | Status: DC
  Administered 2022-09-24 (×2): 40 mg via INTRAVENOUS
  Filled 2022-09-24 (×3): qty 10

## 2022-09-24 MED ORDER — LIDOCAINE VISCOUS HCL 2 % MT SOLN
OROMUCOSAL | Status: AC
  Filled 2022-09-24: qty 15

## 2022-09-24 NOTE — Assessment & Plan Note (Signed)
Continue with statin therapy.  ?

## 2022-09-24 NOTE — Transfer of Care (Signed)
Immediate Anesthesia Transfer of Care Note  Patient: Jesus Norman  Procedure(s) Performed: ESOPHAGOGASTRODUODENOSCOPY (EGD) WITH PROPOFOL  Patient Location: PACU  Anesthesia Type:General  Level of Consciousness: awake, alert , oriented and patient cooperative  Airway & Oxygen Therapy: Patient Spontanous Breathing  Post-op Assessment: Report given to RN, Post -op Vital signs reviewed and stable and Patient moving all extremities X 4  Post vital signs: Reviewed and stable  Last Vitals:  Vitals Value Taken Time  BP 96/52 09/24/22 1300  Temp 36.5 C 09/24/22 1257  Pulse 65 09/24/22 1301  Resp 16 09/24/22 1301  SpO2 97 % 09/24/22 1301  Vitals shown include unvalidated device data.  Last Pain:  Vitals:   09/24/22 1257  TempSrc:   PainSc: Asleep         Complications: No notable events documented.

## 2022-09-24 NOTE — Assessment & Plan Note (Signed)
Blood pressure has been stable, plan to resume amlodipine for blood pressure control.

## 2022-09-24 NOTE — Anesthesia Preprocedure Evaluation (Signed)
Anesthesia Evaluation  Patient identified by MRN, date of birth, ID band Patient awake    Reviewed: Allergy & Precautions, NPO status , Patient's Chart, lab work & pertinent test results  Airway Mallampati: III  TM Distance: >3 FB Neck ROM: Full    Dental  (+) Dental Advisory Given, Missing   Pulmonary COPD,  COPD inhaler, former smoker, PE   Pulmonary exam normal breath sounds clear to auscultation       Cardiovascular Exercise Tolerance: Good hypertension, Pt. on medications + DVT  Normal cardiovascular exam+ Valvular Problems/Murmurs  Rhythm:Regular Rate:Normal     Neuro/Psych negative neurological ROS  negative psych ROS   GI/Hepatic Neg liver ROS, GERD (nissen fundoplication)  Poorly Controlled and Medicated,  Endo/Other  negative endocrine ROS  Renal/GU negative Renal ROS  negative genitourinary   Musculoskeletal negative musculoskeletal ROS (+)   Abdominal   Peds negative pediatric ROS (+)  Hematology negative hematology ROS (+)   Anesthesia Other Findings   Reproductive/Obstetrics negative OB ROS                            Anesthesia Physical Anesthesia Plan  ASA: 3  Anesthesia Plan: General   Post-op Pain Management: Minimal or no pain anticipated   Induction: Intravenous  PONV Risk Score and Plan: Propofol infusion  Airway Management Planned: Nasal Cannula and Natural Airway  Additional Equipment:   Intra-op Plan:   Post-operative Plan: Possible Post-op intubation/ventilation  Informed Consent: I have reviewed the patients History and Physical, chart, labs and discussed the procedure including the risks, benefits and alternatives for the proposed anesthesia with the patient or authorized representative who has indicated his/her understanding and acceptance.     Dental advisory given  Plan Discussed with: CRNA and Surgeon  Anesthesia Plan Comments: (GA with  ETT was discussed.)       Anesthesia Quick Evaluation

## 2022-09-24 NOTE — Progress Notes (Signed)
Patient arrived to room 340 from ED.  Assessment complete, VS obtained, and Admission database began.  Deputy at bedside.

## 2022-09-24 NOTE — Anesthesia Postprocedure Evaluation (Signed)
Anesthesia Post Note  Patient: Jesus Norman  Procedure(s) Performed: ESOPHAGOGASTRODUODENOSCOPY (EGD) WITH PROPOFOL  Patient location during evaluation: PACU Anesthesia Type: General Level of consciousness: awake and alert and oriented Pain management: pain level controlled Vital Signs Assessment: post-procedure vital signs reviewed and stable Respiratory status: spontaneous breathing, nonlabored ventilation and respiratory function stable Cardiovascular status: blood pressure returned to baseline and stable Postop Assessment: no apparent nausea or vomiting Anesthetic complications: no   No notable events documented.   Last Vitals:  Vitals:   09/24/22 1303 09/24/22 1315  BP: 118/63 117/74  Pulse: 66 (!) 58  Resp: 20 18  Temp:  36.7 C  SpO2: 96% 97%    Last Pain:  Vitals:   09/24/22 1315  TempSrc: Oral  PainSc:                  Jesus Norman

## 2022-09-24 NOTE — H&P (Signed)
History and Physical    Patient: Jesus Norman DEY:[id number] DOB: [birth year] DOA: 09/23/2022 DOS: the patient was seen and examined on 09/24/2022 PCP: Patient, No Pcp Per  Patient coming from: Home  Chief Complaint:  Chief Complaint  Patient presents with   Dysphagia   HPI: Jesus Norman is a 46 y.o. male with medical history significant of upper GI bleed, pituitary abnormality, aortic valve disorder, presents the ED with a chief complaint of hematemesis.  Patient reports that he had some dysphagia 2 days ago.  This is not normal for him and he has a history of having his esophagus stretched.  Then today at 4 PM he started having hematemesis.  He reports it was just a couple tablespoons of blood each time.  He had about 7 episodes.  There was no undigested food mixed in with the blood.  Patient reports he has associated chest pain that is intermittent.  Feels like a pressure and a burning.  Substernal.  Consistent with esophageal pain.  Patient has not been short of breath.  Reports he has had some palpitations.  Patient reports when he had dysphagia he is still able to swallow liquids, but not solids.  His last normal meal was Wednesday.  His last normal bowel movement was the same day of presentation.  Patient reports he has a history of esophageal varices that were identified at [facility] 6 or 7 years ago.  Patient reports other than this has been in his normal state of health.  Patient does not smoke, does not drink, does not use illicit drugs.  He is vaccinated for COVID.  He is full code.  Of note patient used to drink and his last drink was in June.  He used to use cocaine, last use was about 10 years ago. Review of Systems: As mentioned in the history of present illness. All other systems reviewed and are negative. Past Medical History:  Diagnosis Date   Aortic valve disorder    GI bleed    History of benign esophageal tumor    Pituitary abnormality [facility]    Past Surgical History:   Procedure Laterality Date   ESOPHAGUS SURGERY     nissan fundiplication     Social History:  reports that he has quit smoking. His smoking use included cigarettes. He smoked an average of .5 packs per day. He has never used smokeless tobacco. He reports current alcohol use. He reports that he does not use drugs.  Allergies  Allergen Reactions   Compazine [Prochlorperazine Edisylate]    Doxycycline    Gluten Meal    Keflex [Cephalexin]    Nsaids    Penicillins    Reglan [Metoclopramide]    Wheat Bran Diarrhea    Pt has Celiac disease    Family History  Problem Relation Age of Onset   COPD Mother    Heart failure Mother    Heart failure Father    Cancer Father    COPD Father     Prior to Admission medications   Medication Sig Start Date End Date Taking? Authorizing Provider  acetaminophen (TYLENOL) 325 MG tablet Take 325 mg by mouth every 6 (six) hours as needed for moderate pain.   Yes [provider]  albuterol (PROVENTIL HFA;VENTOLIN HFA) 108 (90 Base) MCG/ACT inhaler Inhale 2 puffs into the lungs every 6 (six) hours as needed for wheezing.   Yes [provider]  amLODipine (NORVASC) 5 MG tablet Take by mouth. 04/18/22  Yes [provider]  ascorbic acid (VITAMIN C) 500 MG tablet Take by mouth.   Yes [provider]  baclofen (LIORESAL) 10 MG tablet Take 10 mg by mouth 3 (three) times daily. 09/15/16  Yes [provider]  buPROPion (WELLBUTRIN) 75 MG tablet Take 150 mg by mouth 2 (two) times daily. 10/29/16  Yes [provider]  dicyclomine (BENTYL) 20 MG tablet Take 20 mg by mouth 4 (four) times daily.   Yes [provider]  ferrous sulfate 325 (65 FE) MG tablet Take by mouth.   Yes [provider]  fluticasone-salmeterol (ADVAIR) 250-50 MCG/ACT AEPB Inhale into the lungs.   Yes [provider]  furosemide (LASIX) 20 MG tablet Take by mouth daily. 07/06/16 09/23/22 Yes [provider]   mometasone-formoterol (DULERA) 100-5 MCG/ACT AERO Inhale into the lungs. 02/11/22  Yes [provider]  Multiple Vitamin (MULTI-VITAMIN) tablet Take 1 tablet by mouth daily.   Yes [provider]  naloxone (NARCAN) nasal spray 4 mg/0.1 mL Place into the nose. 04/07/21  Yes [provider]  nitroGLYCERIN (NITROSTAT) 0.4 MG SL tablet Place 0.4 mg under the tongue once. 11/02/16  Yes [provider]  ondansetron (ZOFRAN-ODT) 4 MG disintegrating tablet Take 4 mg by mouth every 8 (eight) hours as needed for nausea/vomiting. 09/15/16  Yes [provider]  oxyCODONE-acetaminophen (PERCOCET/ROXICET) 5-325 MG tablet Take 1 tablet by mouth every 4 (four) hours as needed for severe pain. 01/01/17  Yes Isla Pence, MD  rivaroxaban (XARELTO) 20 MG TABS tablet Take 20 mg by mouth daily. 02/10/19  Yes [provider]  rosuvastatin (CRESTOR) 10 MG tablet Take by mouth. 02/11/22  Yes [provider]  spironolactone (ALDACTONE) 25 MG tablet Take 25 mg by mouth daily. 02/11/22  Yes [provider]  sucralfate (CARAFATE) 1 g tablet Take 1 g by mouth 4 (four) times daily.   Yes [provider]  tirzepatide Darcel Bayley) 5 MG/0.5ML Pen Inject into the skin. 03/12/22  Yes [provider]  vitamin C (ASCORBIC ACID) 500 MG tablet Take 500 mg by mouth 2 (two) times daily.   Yes [provider]  diphenhydrAMINE (BENADRYL) 25 mg capsule Take by mouth.    [provider]  divalproex (DEPAKOTE ER) 500 MG 24 hr tablet Take 1,000 mg by mouth at bedtime.    [provider]    Physical Exam: Vitals:   09/23/22 2230 09/23/22 2310 09/24/22 0003 09/24/22 0034  BP: (!) 108/53  (!) 110/56 131/74  Pulse: 89  100 94  Resp: 16  18 16   Temp:  98.5 F (36.9 C)  97.6 F (36.4 C)  TempSrc:  Oral    SpO2: 94%  95% 98%  Weight:    125.4 kg  Height:       1.  General: Patient lying supine in bed,  no acute distress   2.  Psychiatric: Alert and oriented x 3, mood and behavior normal for situation, pleasant and cooperative with exam   3. Neurologic: Speech and language are normal, face is symmetric, moves all 4 extremities voluntarily, at baseline without acute deficits on limited exam   4. HEENMT:  Head is atraumatic, normocephalic, pupils reactive to light, neck is supple, trachea is midline, mucous membranes are moist   5. Respiratory : Lungs are clear to auscultation bilaterally without wheezing, rhonchi, rales, no cyanosis, no increase in work of breathing or accessory muscle use   6. Cardiovascular : Heart rate normal, rhythm is regular, no rubs or gallops, no  peripheral edema, peripheral pulses palpated   7. Gastrointestinal:  Abdomen is soft, nondistended, nontender to palpation bowel sounds active, no masses or organomegaly palpated   8. Skin:  Skin is warm, dry and intact without rashes, acute lesions, or ulcers on limited exam   9.Musculoskeletal:  No acute deformities or trauma, no asymmetry in tone, no peripheral edema, peripheral pulses palpated, no tenderness to palpation in the extremities  Data Reviewed: In the ED Temp 98.2, heart rate 88-112, respiratory rate 19-20, blood pressure 108/58-131/78, satting at 93% No leukocytosis with white blood cell count of 10.6, hemoglobin 14.4 Chemistry reveals a hypokalemia with potassium 3.4 INR 1.4 Alcohol level less than 10 Chest x-ray shows no active disease EKG shows a sinus tach with QTc 486 Protonix 40 mg IV given in the ED Chest x-ray shows no active disease GI consulted and advises Protonix with plan to do scope in the a.m.  Assessment and Plan: * GI bleed - Hematemesis reported - Hemoglobin stable at 14.4 - Trend hemoglobin every 6 hours - GI consulted and recommends Protonix and they plan to scope in the a.m. - Protonix started in the ED, continue twice daily - N.p.o. for scope - Continue to monitor  COPD (chronic  obstructive pulmonary disease) (HCC) - Continue albuterol as needed - Continue Dulera  Hyperlipidemia - Continue Crestor  Essential hypertension - Holding amlodipine due to soft blood pressures in the ER  Hypokalemia - Potassium 3.4 - Replace and recheck - Continue to monitor      Advance Care Planning:   Code Status: Full Code  Consults: GI  Family Communication: No family at bedside  Severity of Illness: The appropriate patient status for this patient is INPATIENT. Inpatient status is judged to be reasonable and necessary in order to provide the required intensity of service to ensure the patient's safety. The patient's presenting symptoms, physical exam findings, and initial radiographic and laboratory data in the context of their chronic comorbidities is felt to place them at high risk for further clinical deterioration. Furthermore, it is not anticipated that the patient will be medically stable for discharge from the hospital within 2 midnights of admission.   * I certify that at the point of admission it is my clinical judgment that the patient will require inpatient hospital care spanning beyond 2 midnights from the point of admission due to high intensity of service, high risk for further deterioration and high frequency of surveillance required.*  Author: Lilyan Gilford, DO 09/24/2022 4:29 AM  For on call review [email or web address].

## 2022-09-24 NOTE — Consult Note (Signed)
Gastroenterology Consult   Referring Provider: No ref. provider found Primary Care Physician:  Patient, No Pcp Per Primary Gastroenterologist:  Dr. Titus Dubin. Josetta Huddle [facility])  Patient ID: Jesus Norman; [id number]; Jun 22, 1976   Admit date: 09/23/2022  LOS: 1 day   Date of Consultation: 09/24/2022  Reason for Consultation:  hematemesis, chronic dysphagia  History of Present Illness   Jesus Norman is a 46 y.o. year old male with history of upper GI bleed, pituitary abnormality, aortic valve disorder, VTE on Xarelto, HTN, rainouts, celiac disease, and GERD s/p Nissen fundoplication in 2012, and chronic intermittent dysphagia who [facility] [place] [facility].  He also had reports of dysphagia 2 days prior.  He reported about 7 episodes of small amounts of hematemesis with associated substernal burning and pressure.  GI consulted for further evaluation and management.  ED course: Vital signs stable, intermittent tachycardia up to 112.  Hemoglobin stable at 14.4. Mild hypokalemia with potassium 3.4, INR 1.4.,  Negative alcohol CXR unremarkable EKG with sinus tachycardia with QTc 46 Protonix given, made NPO.   Consult: Patient has history of multiple prior upper endoscopies and esophageal dilations.  Per review of chart he has seen providers [facility]  There is report from the patient that he was told he had esophageal varices at one point.  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  EGD with dilation in December 2016 in October 2017 which did not improve his dysphagia.  EGD April 2018 with normal esophagus s/p dilation, eroding gastrostomy tube present, PEG tube removed percutaneously, scalloped mucosa in the duodenum.  Was to be rescheduled for EGD for placement of esophageal manometry.  Was given feeding  trial and if not tolerated then to repeat gastrostomy.  Corpak was placed given failed trial of p.o. feeding.  Also report of previous Botox injection to LES.  His NG tube has been became clogged and he underwent replacement of gastrostomy tube on 04/05/2017  EGD December 2018 with normal esophagus s/p 30 mm pneumatic balloon dilation, healthy-appearing mucosa of antireflux surgical site, intact gastrostomy tube.  He then presented with hematemesis a week later and underwent repeat EGD with evidence of old blood with intact G-tube site, erythema in the stomach.  EGD earlier this year at [facility] in May with small amount of dark material in the stomach, no active bleeding, intact Nissen wrap, esophagus dilated with 60 [place] Maloney dilator.  Mallory-Weiss tear suspected.  His Xarelto was resumed and his hemoglobin remained stable.  He was advised to continue on iron daily and Protonix 40 mg twice daily as well as sucralfate 1 g 4 times daily.  Most recent EGD 08/13/2022 with blunted duodenal villi, gastric erythema, intact Nissen fundoplication.  There was not active evidence of hemorrhage.  Biopsies consistent with active celiac disease.  He was advised to continue PPI twice daily and sucralfate 3 times daily and at bedtime and advised to follow strict gluten-free diet.  He noted chronic issue with dysphagia, occurring again 2 days ago and then yesterday had a few episodes of vomiting with some bright red blood and some dark contents as well. States he had grits and applesauce as last meal yesterday. Has been having some nausea controlled with Zofran but no vomiting since admission. Denies any abdominal pain but does have some substernal chest discomfort and tightness in his esophagus.  Has been passing gas and had a loose bowel movement this morning that is typical for him. Denies any melena or BRBPR.   Reports he primarily follows with Dr. Enrigue CatenaShaheen and [facility] and there was talks of performing a  special procedure that is to help with his esophageal spasms but has been unable to get a follow up appointment to see him.   Last dose of Xarelto yesterday morning.   Past Medical History:  Diagnosis Date   Aortic valve disorder    GI bleed    History of benign esophageal tumor    Pituitary abnormality [facility](HCC)     Past Surgical History:  Procedure Laterality Date   ESOPHAGUS SURGERY     nissan fundiplication      Prior to Admission medications   Medication Sig Start Date End Date Taking? Authorizing Provider  acetaminophen (TYLENOL) 325 MG tablet Take 325 mg by mouth every 6 (six) hours as needed for moderate pain.   Yes [provider]  albuterol (PROVENTIL HFA;VENTOLIN HFA) 108 (90 Base) MCG/ACT inhaler Inhale 2 puffs into the lungs every 6 (six) hours as needed for wheezing.   Yes [provider]  amLODipine (NORVASC) 5 MG tablet Take by mouth. 04/18/22  Yes [provider]  ascorbic acid (VITAMIN C) 500 MG tablet Take by mouth.   Yes [provider]  baclofen (LIORESAL) 10 MG tablet Take 10 mg by mouth 3 (three) times daily. 09/15/16  Yes [provider]  buPROPion (WELLBUTRIN) 75 MG tablet Take 150 mg by mouth 2 (two) times daily. 10/29/16  Yes [provider]  dicyclomine (BENTYL) 20 MG tablet Take 20 mg by mouth 4 (four) times daily.   Yes [provider]  ferrous sulfate 325 (65 FE) MG tablet Take by mouth.   Yes [provider]  fluticasone-salmeterol (ADVAIR) 250-50 MCG/ACT AEPB Inhale into the lungs.   Yes [provider]  furosemide (LASIX) 20 MG tablet Take by mouth daily. 07/06/16 09/23/22 Yes [provider]  mometasone-formoterol (DULERA) 100-5 MCG/ACT AERO Inhale into the lungs. 02/11/22  Yes [provider]  Multiple Vitamin (MULTI-VITAMIN) tablet Take 1 tablet by mouth daily.   Yes [provider]  naloxone (NARCAN) nasal spray 4 mg/0.1 mL Place into the nose.  04/07/21  Yes [provider]  nitroGLYCERIN (NITROSTAT) 0.4 MG SL tablet Place 0.4 mg under the tongue once. 11/02/16  Yes [provider]  ondansetron (ZOFRAN-ODT) 4 MG disintegrating tablet Take 4 mg by mouth every 8 (eight) hours as needed for nausea/vomiting. 09/15/16  Yes [provider]  oxyCODONE-acetaminophen (PERCOCET/ROXICET) 5-325 MG tablet Take 1 tablet by mouth every 4 (four) hours as needed for severe pain. 01/01/17  Yes Jacalyn LefevreHaviland, Julie, MD  rivaroxaban (XARELTO) 20 MG TABS tablet Take 20 mg by mouth daily. 02/10/19  Yes [provider]  rosuvastatin (CRESTOR) 10 MG tablet Take by mouth. 02/11/22  Yes [provider]  spironolactone (ALDACTONE) 25 MG tablet Take 25 mg by mouth daily. 02/11/22  Yes [provider]  sucralfate (CARAFATE) 1 g tablet Take 1 g by mouth 4 (four) times daily.   Yes [provider]  tirzepatide Greggory Keen(MOUNJARO) 5 MG/0.5ML Pen Inject into the skin. 03/12/22  Yes [provider]  vitamin C (ASCORBIC ACID) 500 MG tablet Take 500 mg by mouth 2 (two) times daily.   Yes [provider]  diphenhydrAMINE (BENADRYL) 25 mg capsule Take by mouth.    [provider]  divalproex (DEPAKOTE ER) 500 MG 24 hr tablet Take 1,000 mg by mouth at bedtime.    [provider]    Current Facility-Administered Medications  Medication Dose Route Frequency Provider Last Rate Last Admin   0.9 %  sodium chloride infusion   Intravenous Continuous Zierle-Ghosh, Asia B, DO 75 mL/hr at 09/24/22 0113 New Bag at 09/24/22 0113   acetaminophen (TYLENOL) tablet 650 mg  650 mg Oral Q6H PRN Zierle-Ghosh, Asia B, DO       Or   acetaminophen (TYLENOL) suppository 650 mg  650 mg Rectal Q6H PRN Zierle-Ghosh, Asia B, DO       albuterol (PROVENTIL) (2.5 MG/3ML) 0.083% nebulizer solution 2.5 mg  2.5 mg Nebulization Q6H PRN Zierle-Ghosh, Asia B, DO       buPROPion (WELLBUTRIN) tablet 150 mg  150 mg Oral BID Zierle-Ghosh,  Asia B, DO       metoCLOPramide (REGLAN) injection 10 mg  10 mg Intravenous Q8H Zierle-Ghosh, Asia B, DO   10 mg at 09/24/22 0554   mometasone-formoterol (DULERA) 200-5 MCG/ACT inhaler 2 puff  2 puff Inhalation BID Zierle-Ghosh, Asia B, DO       ondansetron (ZOFRAN) tablet 4 mg  4 mg Oral Q6H PRN Zierle-Ghosh, Asia B, DO       Or   ondansetron (ZOFRAN) injection 4 mg  4 mg Intravenous Q6H PRN Zierle-Ghosh, Asia B, DO       oxyCODONE (Oxy IR/ROXICODONE) immediate release tablet 5 mg  5 mg Oral Q4H PRN Zierle-Ghosh, Asia B, DO   5 mg at 09/24/22 0558   pantoprazole (PROTONIX) injection 40 mg  40 mg Intravenous Q12H Zierle-Ghosh, Asia B, DO       rosuvastatin (CRESTOR) tablet 10 mg  10 mg Oral Daily Zierle-Ghosh, Asia B, DO        Allergies as of 09/23/2022 - Review Complete 09/23/2022  Allergen Reaction Noted   Compazine [prochlorperazine edisylate]  01/01/2017   Doxycycline  01/01/2017   Gluten meal  10/04/2014   Keflex [cephalexin]  01/01/2017   Nsaids  01/01/2017   Penicillins  01/01/2017   Reglan [metoclopramide]  01/01/2017   Wheat bran Diarrhea 04/30/2016    Family History  Problem Relation Age of Onset   COPD Mother    Heart failure Mother    Heart failure Father    Cancer Father    COPD Father     Social History   Socioeconomic History   Marital status: Single    Spouse name: Not on file   Number of children: Not on file   Years of education: Not on file   Highest education level: Not on file  Occupational History   Not on file  Tobacco Use   Smoking status: Former    Packs/day: 0.50    Types: Cigarettes   Smokeless tobacco: Never  Substance and Sexual Activity   Alcohol use: Yes    Comment: daily use   Drug use: No   Sexual activity: Not on file  Other Topics Concern   Not on file  Social History Narrative   Not on file   Social Determinants of Health   Financial Resource Strain: Not on file  Food Insecurity: Not on file  Transportation Needs: Not  on file  Physical Activity: Not on file  Stress: Not on file  Social Connections: Not on file  Intimate Partner Violence: Not on file     Review of Systems   Gen: Denies any fever, chills, loss of  appetite, change in weight or weight loss CV: Denies chest pain, heart palpitations, syncope, edema  Resp: Denies shortness of breath with rest, cough, wheezing, coughing up blood, and pleurisy. GI: Denies vomiting blood, jaundice, and fecal incontinence.   Denies dysphagia or odynophagia. GU : Denies urinary burning, blood in urine, urinary frequency, and urinary incontinence. MS: Denies joint pain, limitation of movement, swelling, cramps, and atrophy.  Derm: Denies rash, itching, dry skin, hives. Psych: Denies depression, anxiety, memory loss, hallucinations, and confusion. Heme: Denies bruising or bleeding Neuro:  Denies any headaches, dizziness, paresthesias, shaking  Physical Exam   Vital Signs in last 24 hours: Temp:  [96.4 F (35.8 C)-98.5 F (36.9 C)] 96.4 F (35.8 C) (10/13 0446) Pulse Rate:  [68-112] 68 (10/13 0446) Resp:  [16-20] 18 (10/13 0446) BP: (103-131)/(53-79) 106/79 (10/13 0446) SpO2:  [93 %-100 %] 100 % (10/13 0446) Weight:  [122.9 kg-125.4 kg] 125.4 kg (10/13 0034)    General:   Alert,  Well-developed, well-nourished, pleasant and cooperative in NAD Head:  Normocephalic and atraumatic. Eyes:  Sclera clear, no icterus.   Conjunctiva pink. Ears:  Normal auditory acuity. Neck:  Supple; no masses Lungs:  Clear throughout to auscultation.   No wheezes, crackles, or rhonchi. No acute distress. Heart:  Regular rate and rhythm; no murmurs, clicks, rubs,  or gallops. Abdomen:  Soft,  nondistended. Tenderness to epigastric region. No masses, hepatosplenomegaly or hernias noted. Normal bowel sounds, without guarding, and without rebound.   Rectal: deferred  Extremities:  Without clubbing or edema. Neurologic:  Alert and  oriented x4. Skin:  Intact without significant  lesions or rashes. Psych:  Alert and cooperative. Normal mood and affect.  Intake/Output from previous day: 10/12 0701 - 10/13 0700 In: 675 [I.V.:375; IV Piggyback:300] Out: -  Intake/Output this shift: No intake/output data recorded.   Labs/Studies   Recent Labs Recent Labs    09/23/22 1929 09/23/22 2319 09/24/22 0433  WBC 10.6*  --  9.7  HGB 14.4 12.7* 12.6*  HCT 43.5 38.8* 38.5*  PLT 276  --  242   BMET Recent Labs    09/23/22 1929 09/24/22 0042  NA 139 139  K 3.4* 3.3*  CL 108 105  CO2 23 23  GLUCOSE 135* 114*  BUN 18 17  CREATININE 0.90 0.86  CALCIUM 8.4* 8.6*   LFT Recent Labs    09/23/22 1929 09/24/22 0042  PROT 6.9 6.8  ALBUMIN 4.2 4.1  AST 28 29  ALT 48* 50*  ALKPHOS 79 77  BILITOT 0.4 0.4   PT/INR Recent Labs    09/23/22 1929  LABPROT 16.7*  INR 1.4*   Hepatitis Panel No results for input(s): "HEPBSAG", "HCVAB", "HEPAIGM", "HEPBIGM" in the last 72 hours. C-Diff No results for input(s): "CDIFFTOX" in the last 72 hours.  Radiology/Studies DG Chest Portable 1 View  Result Date: 09/23/2022 CLINICAL DATA:  Chest pain.  Hematemesis. EXAM: PORTABLE CHEST 1 VIEW COMPARISON:  Chest 01/01/2017 FINDINGS: The heart size and mediastinal contours are within normal limits. Both lungs are clear. The visualized skeletal structures are unremarkable. IMPRESSION: No active disease. Electronically Signed   By: Marlan Palau M.D.   On: 09/23/2022 19:31     Assessment   Jesus Norman is a 46 y.o. year old male upper GI bleed, pituitary abnormality, aortic valve disorder, VTE on Xarelto, HTN, rainouts, celiac disease, and GERD s/p Nissen fundoplication in 2012, and chronic intermittent dysphagia with prior gastrostomy tube who presented to the ED with hematemesis and recent  dysphagia.  GI consulted for further evaluation and management.  Hematemesis/dysphagia: Chronic dysphagia and multiple hospital admissions for hematemesis.  He presented with mild sinus  tachycardia.  Hemoglobin stable at 14.4.  Patient reported about 7 episodes of hematemesis.  Per review of chart patient has had multiple EGDs in the past dating back to 2012 and has had prior Nissan fundoplication due to chronic GERD.  He at one point has had gastrostomy tube.  He has had various esophageal dilations in the past, and multiple EGDs each year with his last EGD being 08/13/2022 at [facility] revealing evidence of active celiac disease, no active bleeding within the stomach despite admission for hematemesis, and evidence of intact Niesen fundoplication.  Also history of Mallory-Weiss tear. No more vomiting since admission. Continues to have some intermittent nausea as well as substernal pain. Has been following gluten free diet while in jail. Tenderness to epigastric region on palpation. Last dose of Xarelto yesterday morning. Will proceed with EGD today to evaluate for gastritis, Mallory Weiss tear, peptic ulcer, or other vascular abnormality. Continue PPI BID.    Plan / Recommendations   N.p.o. until after procedure Continue to hold Xarelto EGD today with Dr. Marletta Lor Continue PPI BID Maintain strict gluten free diet post procedure.       09/24/2022, 8:41 AM  Brooke Bonito, MSN, FNP-BC, AGACNP-BC [facility]

## 2022-09-24 NOTE — Progress Notes (Signed)
Patient seen and examined; admitted after midnight secondary to difficulty swallowing, odynophagia and hematemesis.  Hemodynamically stable and with blood work demonstrating a stable hemoglobin level.  Currently n.p.o. pending GI consultation and endoscopic evaluation.  Continue supportive care and IV PPI.  Please refer to H&P written by Dr. Clearence Ped on 09/24/2022 for further info/details on admission.  Barton Dubois MD [phone]

## 2022-09-24 NOTE — Assessment & Plan Note (Signed)
No clinical signs of exacerbation, continue with bronchodilator therapy.

## 2022-09-24 NOTE — Assessment & Plan Note (Signed)
Electrolytes have been corrected.  Today renal function with serum cr at 0,70, K is 4,4 and serum bicarbonate 22. Follow up renal function and electrolytes as outpatient.

## 2022-09-24 NOTE — Op Note (Signed)
[facility] Patient Name: Jesus Norman Procedure Date: 09/24/2022 12:31 PM MRN: [id number] Date of Birth: 08-25-1976 Attending MD: Elon Alas. Abbey Chatters DO CSN: [id number] Age: 46 Admit Type: Outpatient Procedure:                Upper GI endoscopy Indications:              Dysphagia, Hematemesis Providers:                Elon Alas. Abbey Chatters, DO, Jessica Boudreaux, Casimer Bilis, Technician Referring MD:              Medicines:                See the Anesthesia note for documentation of the                            administered medications Complications:            No immediate complications. Estimated Blood Loss:     Estimated blood loss was minimal. Procedure:                Pre-Anesthesia Assessment:                           - The anesthesia plan was to use monitored                            anesthesia care (MAC).                           After obtaining informed consent, the endoscope was                            passed under direct vision. Throughout the                            procedure, the patient's blood pressure, pulse, and                            oxygen saturations were monitored continuously. The                            [id number]-H190 ([id number]) scope was introduced through the                            mouth, and advanced to the second part of duodenum.                            The upper GI endoscopy was accomplished without                            difficulty. The patient tolerated the procedure                            well. Scope In: 12:50:07 PM Scope  Out: 12:53:09 PM Total Procedure Duration: 0 hours 3 minutes 2 seconds  Findings:      Evidence of a Nissen fundoplication was found at the gastroesophageal       junction. The wrap appeared intact. This was traversed.      There is no endoscopic evidence of bleeding, esophagitis, inflammation,       ulcerations or varices in the entire esophagus.      Patchy mild  inflammation characterized by erythema was found in the       gastric body and in the gastric antrum.      Scalloped mucosa was found in the second portion of the duodenum. Impression:               - A Nissen fundoplication was found. The wrap                            appears intact.                           - Gastritis.                           - Duodenal mucosal changes seen, diagnostic of                            celiac disease.                           - No specimens collected. Moderate Sedation:      Per Anesthesia Care Recommendation:           - Return patient to hospital ward for ongoing care.                           - Gluten free diet.                           - Use a proton pump inhibitor PO BID.                           - No active or stigmata of bleeding on today's exam                           - okay to resume Xarelto Procedure Code(s):        --- Professional ---                           [phone], Esophagogastroduodenoscopy, flexible,                            transoral; diagnostic, including collection of                            specimen(s) by brushing or washing, when performed                            (separate procedure) Diagnosis Code(s):        --- Professional ---  Z98.890, Other specified postprocedural states                           K29.70, Gastritis, unspecified, without bleeding                           K90.0, Celiac disease                           R13.10, Dysphagia, unspecified                           K92.0, Hematemesis CPT copyright 2019 [employer]. All rights reserved. The codes documented in this report are preliminary and upon coder review may  be revised to meet current compliance requirements. Elon Alas. Abbey Chatters, DO [name], DO 09/24/2022 1:01:04 PM This report has been signed electronically. Number of Addenda: 0

## 2022-09-24 NOTE — TOC Progression Note (Signed)
  Transition of Care [facility]) Screening Note   Patient Details  Name: Jesus Norman Date of Birth: 10-19-1976   Transition of Care [facility]) CM/SW Contact:    Boneta Lucks, RN Phone Number: 09/24/2022, 11:52 AM    Transition of Care Department [facility]) has reviewed patient and no TOC needs have been identified at this time. We will continue to monitor patient advancement through interdisciplinary progression rounds. If new patient transition needs arise, please place a TOC consult.    Expected Discharge Plan: [place]    Expected Discharge Plan and Services Expected Discharge Plan: [place]

## 2022-09-24 NOTE — Assessment & Plan Note (Addendum)
-  Patient with no further hematemesis or melena. -Follow up hgb has remained stable (around 13.3 range) -He continue to have dysphagia but not vomiting or abdominal pain. -EGD with Nissen funduplication, gastritis and duodenal changes consistent with celiac disease; probably contributing to some esophageal dysmotility. -Plan to continue pantoprazole bid and Ok to resume anticoagulation per GI rec's.  -continue soft/easy to digest diet and maintain adequate hydration. -avoid the use of NSAID's -continue iron supplementation (ferritin 21)

## 2022-09-25 DIAGNOSIS — E669 Obesity, unspecified: Secondary | ICD-10-CM | POA: Diagnosis present

## 2022-09-25 DIAGNOSIS — J449 Chronic obstructive pulmonary disease, unspecified: Secondary | ICD-10-CM | POA: Diagnosis not present

## 2022-09-25 DIAGNOSIS — Z86718 Personal history of other venous thrombosis and embolism: Secondary | ICD-10-CM

## 2022-09-25 DIAGNOSIS — K922 Gastrointestinal hemorrhage, unspecified: Secondary | ICD-10-CM | POA: Diagnosis not present

## 2022-09-25 DIAGNOSIS — K2100 Gastro-esophageal reflux disease with esophagitis, without bleeding: Secondary | ICD-10-CM | POA: Diagnosis not present

## 2022-09-25 DIAGNOSIS — E876 Hypokalemia: Secondary | ICD-10-CM | POA: Diagnosis not present

## 2022-09-25 LAB — CBC WITH DIFFERENTIAL/PLATELET
Abs Immature Granulocytes: 0.02 10*3/uL (ref 0.00–0.07)
Basophils Absolute: 0.1 10*3/uL (ref 0.0–0.1)
Basophils Relative: 1 %
Eosinophils Absolute: 0.4 10*3/uL (ref 0.0–0.5)
Eosinophils Relative: 6 %
HCT: 40.5 % (ref 39.0–52.0)
Hemoglobin: 13.3 g/dL (ref 13.0–17.0)
Immature Granulocytes: 0 %
Lymphocytes Relative: 20 %
Lymphs Abs: 1.3 10*3/uL (ref 0.7–4.0)
MCH: 29.6 pg (ref 26.0–34.0)
MCHC: 32.8 g/dL (ref 30.0–36.0)
MCV: 90.2 fL (ref 80.0–100.0)
Monocytes Absolute: 0.5 10*3/uL (ref 0.1–1.0)
Monocytes Relative: 7 %
Neutro Abs: 4.3 10*3/uL (ref 1.7–7.7)
Neutrophils Relative %: 66 %
Platelets: 259 10*3/uL (ref 150–400)
RBC: 4.49 MIL/uL (ref 4.22–5.81)
RDW: 15 % (ref 11.5–15.5)
WBC: 6.6 10*3/uL (ref 4.0–10.5)
nRBC: 0 % (ref 0.0–0.2)

## 2022-09-25 LAB — BASIC METABOLIC PANEL
Anion gap: 6 (ref 5–15)
BUN: 10 mg/dL (ref 6–20)
CO2: 22 mmol/L (ref 22–32)
Calcium: 8.2 mg/dL — ABNORMAL LOW (ref 8.9–10.3)
Chloride: 112 mmol/L — ABNORMAL HIGH (ref 98–111)
Creatinine, Ser: 0.7 mg/dL (ref 0.61–1.24)
GFR, Estimated: >60 mL/min (ref >60)
Glucose, Bld: 111 mg/dL — ABNORMAL HIGH (ref 70–99)
Potassium: 4.4 mmol/L (ref 3.5–5.1)
Sodium: 140 mmol/L (ref 135–145)

## 2022-09-25 MED ORDER — RIVAROXABAN 10 MG PO TABS: 10.0000 mg | ORAL_TABLET | Freq: Every day | ORAL | Status: DC

## 2022-09-25 MED ORDER — RIVAROXABAN 20 MG PO TABS
20.0000 mg | ORAL_TABLET | Freq: Every day | ORAL | Status: DC
  Administered 2022-09-25: 20 mg via ORAL
  Filled 2022-09-25: qty 1

## 2022-09-25 MED ORDER — OXYCODONE HCL 5 MG PO TABS
5.0000 mg | ORAL_TABLET | Freq: Four times a day (QID) | ORAL | Status: DC | PRN
  Administered 2022-09-25 – 2022-09-26 (×4): 5 mg via ORAL
  Filled 2022-09-25 (×4): qty 1

## 2022-09-25 MED ORDER — AMLODIPINE BESYLATE 5 MG PO TABS
5.0000 mg | ORAL_TABLET | Freq: Every day | ORAL | Status: DC
  Administered 2022-09-25 – 2022-09-26 (×2): 5 mg via ORAL
  Filled 2022-09-25 (×2): qty 1

## 2022-09-25 MED ORDER — PANTOPRAZOLE SODIUM 40 MG PO TBEC
40.0000 mg | DELAYED_RELEASE_TABLET | Freq: Two times a day (BID) | ORAL | Status: DC
  Administered 2022-09-25 – 2022-09-26 (×3): 40 mg via ORAL
  Filled 2022-09-25 (×3): qty 1

## 2022-09-25 NOTE — Hospital Course (Addendum)
Jesus Norman was admitted to the hospital with the working diagnosis of gastrointestinal bleeding.   46 yo male with the past medical history of upper GI bleeding, and aortic valve disease, T2DM, pituitary tumor, heart failure and COPD who presented with dysphagia for 2 days. On the day of hospitalization he had hematemesis, about 7 episodes, with associated chest pain. On his initial physical examination his blood pressure was 108/53, HR 89, RR 16 and 02 saturation 94%, lungs with no wheezing or rales, heart with S1 and S2 present and rhythmic, abdomen not distended or tender and no lower extremity edema.  Na 139, K 3,4 CL 108, bicarbonate 23, glucose 135 bun 18 cr 0,90  Wbc 10,6 hgb 14.4 plt 276   Chest radiograph with no cardiomegaly, no infiltrates.  EKG 113 bpm, normal axis, normal intervals, sinus rhythm with no significant ST segment or T wave changes.   Patient was placed on IV proton pump inhibitors and gastroenterology was consulted for upper endoscopy. Patient was found to have gastritis and recommendation is to continue PPI bid and to resume anticoagulation with rivaroxaban.   10/14 patient continue to have dysphagia, changed diet to soft. If tolerates well, plan for discharge tomorrow 10/15

## 2022-09-25 NOTE — Progress Notes (Signed)
Patient rested through the night. IV removed at 0520 due to site being swollen. Patient tolerated well. No prn medications given.

## 2022-09-25 NOTE — Assessment & Plan Note (Signed)
History of DVT 2016, follow up US lower extremities in 2022 negative for deep vein thrombosis.  Pulmonary embolism.  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

## 2022-09-25 NOTE — Assessment & Plan Note (Addendum)
Calculated BMI is 39,5

## 2022-09-25 NOTE — Progress Notes (Signed)
Progress Note   Patient: Jesus Norman DUK:[id number] DOB: 1976/12/01 DOA: 09/23/2022     2 DOS: the patient was seen and examined on 09/25/2022   Brief hospital course: Mr. Burkett was admitted to the hospital with the working diagnosis of gastrointestinal bleeding.   46 yo male with the past medical history of upper GI bleeding, and aortic valve disease, T2DM, pituitary tumor, heart failure and COPD who presented with dysphagia for 2 days. On the day of hospitalization he had hematemesis, about 7 episodes, with associated chest pain. On his initial physical examination his blood pressure was 108/53, HR 89, RR 16 and 02 saturation 94%, lungs with no wheezing or rales, heart with S1 and S2 present and rhythmic, abdomen not distended or tender and no lower extremity edema.  Na 139, K 3,4 CL 108, bicarbonate 23, glucose 135 bun 18 cr 0,90  Wbc 10,6 hgb 14.4 plt 276   Chest radiograph with no cardiomegaly, no infiltrates.  EKG 113 bpm, normal axis, normal intervals, sinus rhythm with no significant ST segment or T wave changes.   Patient was placed on IV proton pump inhibitors and gastroenterology was consulted for upper endoscopy. Patient was found to have gastritis and recommendation is to continue PPI bid and to resume anticoagulation with rivaroxaban.   10/14 patient continue to have dysphagia, changed diet to soft. If tolerates well, plan for discharge tomorrow 10/15   Assessment and Plan: * Acute upper GI bleed Patient with no further hematemesis or melena. Follow up hgb is 13,3  He continue to have dysphagia but not vomiting or abdominal pain. EGD with Nissen funduplication, gastritis and duodenal changes consistent with celiac disease.   Plan to continue pantoprazole bid and Ok to resume anticoagulation.  Will change diet to soft if good toleration, plan to discharge tomorrow.   COPD (chronic obstructive pulmonary disease) (HCC) No clinical signs of exacerbation, continue with  bronchodilator therapy.   Essential hypertension Blood pressure has been stable, plan to resume amlodipine for blood pressure control.   Gastroesophageal reflux disease Continue with proton pump inhibitor bid Change diet to soft, gluten free   Hyperlipidemia Continue with statin therapy   Hypokalemia Electrolytes have been corrected.  Today renal function with serum cr at 0,70, K is 4,4 and serum bicarbonate 22. Follow up renal function and electrolytes as outpatient.   History of DVT (deep vein thrombosis) History of DVT 2016, follow up US lower extremities in 2022 negative for deep vein thrombosis.  Pulmonary embolism.  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   Class 2 obesity Calculated BMI is 39,5         Subjective: Patient continue to have dysphagia, no nausea or vomiting, and no abdominal pain   Physical Exam: Vitals:   09/24/22 1949 09/24/22 2102 09/25/22 0547 09/25/22 1257  BP:  118/77 124/82 118/70  Pulse:  67 62 (!) 58  Resp:  20 20   Temp:  97.8 F (36.6 C) 98.4 F (36.9 C) 98.3 F (36.8 C)  TempSrc:  Oral Oral Oral  SpO2: 94% 97% 98% 96%  Weight:      Height:       Neurology awake and alert ENT with mild pallor Cardiovascular with S1 and S2 present and rhythmic Respiratory with no wheezing Abdomen not distended No lower extremity edema  Data Reviewed:    Family Communication: no family at the bedside   Disposition: Status is: Inpatient Remains inpatient appropriate because: not tolerating regular diet  Planned Discharge Destination:  Jail     Author: Coralie Keens, MD 09/25/2022 1:05 PM  For on call review [email or web address].

## 2022-09-25 NOTE — Assessment & Plan Note (Signed)
Continue with proton pump inhibitor bid Change diet to soft, gluten free

## 2022-09-26 DIAGNOSIS — K922 Gastrointestinal hemorrhage, unspecified: Secondary | ICD-10-CM | POA: Diagnosis not present

## 2022-09-26 DIAGNOSIS — E669 Obesity, unspecified: Secondary | ICD-10-CM

## 2022-09-26 DIAGNOSIS — I10 Essential (primary) hypertension: Secondary | ICD-10-CM | POA: Diagnosis not present

## 2022-09-26 DIAGNOSIS — K2100 Gastro-esophageal reflux disease with esophagitis, without bleeding: Secondary | ICD-10-CM

## 2022-09-26 DIAGNOSIS — J449 Chronic obstructive pulmonary disease, unspecified: Secondary | ICD-10-CM | POA: Diagnosis not present

## 2022-09-26 DIAGNOSIS — Z86718 Personal history of other venous thrombosis and embolism: Secondary | ICD-10-CM

## 2022-09-26 LAB — IRON AND TIBC
Iron: 47 ug/dL (ref 45–182)
Saturation Ratios: 13 % — ABNORMAL LOW (ref 17.9–39.5)
TIBC: 363 ug/dL (ref 250–450)
UIBC: 316 ug/dL

## 2022-09-26 LAB — TRANSFERRIN: Transferrin: 259 mg/dL (ref 180–329)

## 2022-09-26 LAB — FERRITIN: Ferritin: 21 ng/mL — ABNORMAL LOW (ref 24–336)

## 2022-09-26 MED ORDER — PANTOPRAZOLE SODIUM 40 MG PO TBEC: 40.0000 mg | DELAYED_RELEASE_TABLET | Freq: Two times a day (BID) | ORAL | 2 refills | Status: AC

## 2022-09-26 MED ORDER — METOCLOPRAMIDE HCL 10 MG PO TABS: 10.0000 mg | ORAL_TABLET | Freq: Three times a day (TID) | ORAL | 0 refills | Status: AC | PRN

## 2022-09-26 MED ORDER — DICYCLOMINE HCL 20 MG PO TABS: 20.0000 mg | ORAL_TABLET | Freq: Four times a day (QID) | ORAL | Status: AC | PRN

## 2022-09-26 MED ORDER — METOCLOPRAMIDE HCL 10 MG PO TABS: 10.0000 mg | ORAL_TABLET | Freq: Three times a day (TID) | ORAL | 0 refills | Status: DC | PRN

## 2022-09-26 MED ORDER — PANTOPRAZOLE SODIUM 40 MG PO TBEC: 40.0000 mg | DELAYED_RELEASE_TABLET | Freq: Two times a day (BID) | ORAL | 2 refills | Status: DC

## 2022-09-26 NOTE — Progress Notes (Signed)
Discharge instructions given to patient printed Rx's given to patient. Called report to [phone] spoke with MD there who states patient needs to go to [facility].

## 2022-09-26 NOTE — Discharge Summary (Signed)
Physician Discharge Summary   Patient: Jesus Norman MRN: [id number] DOB: 30-Apr-1976  Admit date:     09/23/2022  Discharge date: 09/26/22  Discharge Physician: Barton Dubois   PCP: Patient, No Pcp Per   Recommendations at discharge:  Repeat CBC in 1 week to follow hemoglobin trend/stability. Repeat basic metabolic panel to follow electrolytes renal function Make sure patient follow-up with gastroenterology service as instructed.  Discharge Diagnoses: Principal Problem:   Acute upper GI bleed Active Problems:   COPD (chronic obstructive pulmonary disease) (HCC)   Essential hypertension   Gastroesophageal reflux disease   Hyperlipidemia   Hypokalemia   History of DVT (deep vein thrombosis)   Class 2 obesity  Brief Hospital admission Course: Jesus Norman was admitted to the hospital with the working diagnosis of gastrointestinal bleeding.   46 yo male with the past medical history of upper GI bleeding, and aortic valve disease, T2DM, pituitary tumor, heart failure and COPD who presented with dysphagia for 2 days. On the day of hospitalization he had hematemesis, about 7 episodes, with associated chest pain. On his initial physical examination his blood pressure was 108/53, HR 89, RR 16 and 02 saturation 94%, lungs with no wheezing or rales, heart with S1 and S2 present and rhythmic, abdomen not distended or tender and no lower extremity edema.  Na 139, K 3,4 CL 108, bicarbonate 23, glucose 135 bun 18 cr 0,90  Wbc 10,6 hgb 14.4 plt 276   Chest radiograph with no cardiomegaly, no infiltrates.  EKG 113 bpm, normal axis, normal intervals, sinus rhythm with no significant ST segment or T wave changes.   Patient was placed on IV proton pump inhibitors and gastroenterology was consulted for upper endoscopy.  Assessment and Plan: * Acute upper GI bleed -Patient with no further hematemesis or melena. -Follow up hgb has remained stable (around 13.3 range) -He continue to have dysphagia  but not vomiting or abdominal pain. -EGD with Nissen funduplication, gastritis and duodenal changes consistent with celiac disease; probably contributing to some esophageal dysmotility. -Plan to continue pantoprazole bid and Ok to resume anticoagulation per GI rec's.  -continue soft/easy to digest diet and maintain adequate hydration. -avoid the use of NSAID's -continue iron supplementation (ferritin 21)  COPD (chronic obstructive pulmonary disease) (HCC) -No clinical signs of exacerbation, continue with prior to admission bronchodilator therapy.   Essential hypertension -Blood pressure has been stable -Resume prior to admission antihypertensive agent -Continue to follow heart healthy diet.  Gastroesophageal reflux disease -Continue with proton pump inhibitor bid -Change diet to soft, gluten free  -Continue patient follow-up with GI service.  Hyperlipidemia -Continue with statin therapy   Hypokalemia -Electrolytes have been corrected. -Continue to maintain adequate hydration -Safe to resume home antihypertensive agents/diuretics. -Repeat basic metabolic panel follow-up visit to assess electrolytes and renal function and stability.   History of DVT (deep vein thrombosis) -History of DVT 2016, follow up US lower extremities in 2022 negative for deep vein thrombosis.  -History of pulmonary embolism.  -Patient on chronic anticoagulation due to recurrent deep vein thrombosis.  -Continue current use of Xarelto.  Class 2 obesity -Body mass index is 39.54 kg/m. -Low calorie diet, portion control and increase physical activity discussed with patient.   Consultants: Gastroenterology service Procedures performed: Endoscopy; see below for x-ray reports. Disposition: Home Diet recommendation: Heart healthy diet/low calorie.  DISCHARGE MEDICATION: Allergies as of 09/26/2022       Reactions   Compazine [prochlorperazine Edisylate]    Doxycycline    Gluten Meal  Keflex  [cephalexin]    Nsaids    Penicillins    Reglan [metoclopramide]    Wheat Bran Diarrhea   Pt has Celiac disease        Medication List     STOP taking these medications    mometasone-formoterol 100-5 MCG/ACT Aero Commonly known as: DULERA       TAKE these medications    acetaminophen 325 MG tablet Commonly known as: TYLENOL Take 325 mg by mouth every 6 (six) hours as needed for moderate pain.   albuterol 108 (90 Base) MCG/ACT inhaler Commonly known as: VENTOLIN HFA Inhale 2 puffs into the lungs every 6 (six) hours as needed for wheezing.   amLODipine 5 MG tablet Commonly known as: NORVASC Take by mouth.   ascorbic acid 500 MG tablet Commonly known as: VITAMIN C Take 500 mg by mouth 2 (two) times daily. What changed: Another medication with the same name was removed. Continue taking this medication, and follow the directions you see here.   baclofen 10 MG tablet Commonly known as: LIORESAL Take 10 mg by mouth 3 (three) times daily.   buPROPion 75 MG tablet Commonly known as: WELLBUTRIN Take 150 mg by mouth 2 (two) times daily.   dicyclomine 20 MG tablet Commonly known as: BENTYL Take 1 tablet (20 mg total) by mouth every 6 (six) hours as needed for spasms. What changed:  when to take this reasons to take this   diphenhydrAMINE 25 mg capsule Commonly known as: BENADRYL Take by mouth.   divalproex 500 MG 24 hr tablet Commonly known as: DEPAKOTE ER Take 1,000 mg by mouth at bedtime.   ferrous sulfate 325 (65 FE) MG tablet Take by mouth.   fluticasone-salmeterol 250-50 MCG/ACT Aepb Commonly known as: ADVAIR Inhale into the lungs.   furosemide 20 MG tablet Commonly known as: LASIX Take by mouth daily.   metoCLOPramide 10 MG tablet Commonly known as: REGLAN Take 1 tablet (10 mg total) by mouth every 8 (eight) hours as needed for refractory nausea / vomiting.   Mounjaro 5 MG/0.5ML Pen Generic drug: tirzepatide Inject into the skin.    Multi-Vitamin tablet Take 1 tablet by mouth daily.   naloxone 4 MG/0.1ML Liqd nasal spray kit Commonly known as: NARCAN Place into the nose.   nitroGLYCERIN 0.4 MG SL tablet Commonly known as: NITROSTAT Place 0.4 mg under the tongue once.   ondansetron 4 MG disintegrating tablet Commonly known as: ZOFRAN-ODT Take 4 mg by mouth every 8 (eight) hours as needed for nausea/vomiting.   oxyCODONE-acetaminophen 5-325 MG tablet Commonly known as: PERCOCET/ROXICET Take 1 tablet by mouth every 4 (four) hours as needed for severe pain.   pantoprazole 40 MG tablet Commonly known as: PROTONIX Take 1 tablet (40 mg total) by mouth 2 (two) times daily.   rosuvastatin 10 MG tablet Commonly known as: CRESTOR Take by mouth.   spironolactone 25 MG tablet Commonly known as: ALDACTONE Take 25 mg by mouth daily.   sucralfate 1 g tablet Commonly known as: CARAFATE Take 1 g by mouth 4 (four) times daily.   Xarelto 20 MG Tabs tablet Generic drug: rivaroxaban Take 20 mg by mouth daily.        Discharge Exam: Filed Weights   09/23/22 1826 09/24/22 0034 09/24/22 1103  Weight: 122.9 kg 125.4 kg 125 kg   General exam: Alert, awake, oriented x 3; in no acute distress.  No further episodes of hematemesis or melanotic stools.  Hemodynamically stable.  Still having some intermittent nausea  but for the most part tolerating diet without problems. Respiratory system: Clear to auscultation. Respiratory effort normal.  Good saturation on room air. Cardiovascular system:RRR. No murmurs, rubs, gallops.  No JVD. Gastrointestinal system: Abdomen is nondistended, soft and nontender. No organomegaly or masses felt. Normal bowel sounds heard. Central nervous system: Alert and oriented. No focal neurological deficits. Extremities: No cyanosis or clubbing. Skin: No petechiae. Psychiatry: Judgement and insight appear normal. Mood & affect appropriate.    Condition at discharge: Stable and improved.  The  results of significant diagnostics from this hospitalization (including imaging, microbiology, ancillary and laboratory) are listed below for reference.   Imaging Studies: DG Chest Portable 1 View  Result Date: 09/23/2022 CLINICAL DATA:  Chest pain.  Hematemesis. EXAM: PORTABLE CHEST 1 VIEW COMPARISON:  Chest 01/01/2017 FINDINGS: The heart size and mediastinal contours are within normal limits. Both lungs are clear. The visualized skeletal structures are unremarkable. IMPRESSION: No active disease. Electronically Signed   By: Charles  Clark M.D.   On: 09/23/2022 19:31    Microbiology: No results found for this or any previous visit.  Labs: CBC: Recent Labs  Lab 09/23/22 1929 09/23/22 2319 09/24/22 0433 09/24/22 1107 09/25/22 0514  WBC 10.6*  --  9.7  --  6.6  NEUTROABS 7.1  --  6.1  --  4.3  HGB 14.4 12.7* 12.6* 12.5* 13.3  HCT 43.5 38.8* 38.5* 37.8* 40.5  MCV 88.4  --  89.7  --  90.2  PLT 276  --  242  --  259   Basic Metabolic Panel: Recent Labs  Lab 09/23/22 1929 09/24/22 0042 09/25/22 0514  NA 139 139 140  K 3.4* 3.3* 4.4  CL 108 105 112*  CO2 23 23 22  GLUCOSE 135* 114* 111*  BUN 18 17 10  CREATININE 0.90 0.86 0.70  CALCIUM 8.4* 8.6* 8.2*  MG  --  1.8  --    Liver Function Tests: Recent Labs  Lab 09/23/22 1929 09/24/22 0042  AST 28 29  ALT 48* 50*  ALKPHOS 79 77  BILITOT 0.4 0.4  PROT 6.9 6.8  ALBUMIN 4.2 4.1   CBG: No results for input(s): "GLUCAP" in the last 168 hours.  Discharge time spent: greater than 30 minutes.  Signed: Carlos Madera, MD [facility] 09/26/2022 

## 2022-09-30 ENCOUNTER — Encounter (HOSPITAL_COMMUNITY): Payer: Self-pay | Admitting: Internal Medicine
